# Patient Record
Sex: Female | Born: 1980
Health system: Southern US, Community
[De-identification: ages and names within clinical notes are randomized; demographics above are authoritative.]

## PROBLEM LIST (undated history)

## (undated) DIAGNOSIS — Z8489 Family history of other specified conditions: Secondary | ICD-10-CM

## (undated) DIAGNOSIS — Z1502 Genetic susceptibility to malignant neoplasm of ovary: Secondary | ICD-10-CM

## (undated) DIAGNOSIS — M542 Cervicalgia: Secondary | ICD-10-CM

## (undated) DIAGNOSIS — F329 Major depressive disorder, single episode, unspecified: Secondary | ICD-10-CM

## (undated) DIAGNOSIS — R51 Headache: Secondary | ICD-10-CM

## (undated) DIAGNOSIS — Z1379 Encounter for other screening for genetic and chromosomal anomalies: Secondary | ICD-10-CM

## (undated) DIAGNOSIS — F32A Depression, unspecified: Secondary | ICD-10-CM

## (undated) DIAGNOSIS — R519 Headache, unspecified: Secondary | ICD-10-CM

## (undated) DIAGNOSIS — K219 Gastro-esophageal reflux disease without esophagitis: Secondary | ICD-10-CM

## (undated) DIAGNOSIS — Z1589 Genetic susceptibility to other disease: Secondary | ICD-10-CM

## (undated) DIAGNOSIS — Z803 Family history of malignant neoplasm of breast: Secondary | ICD-10-CM

## (undated) DIAGNOSIS — Z9189 Other specified personal risk factors, not elsewhere classified: Secondary | ICD-10-CM

## (undated) HISTORY — PX: APPENDECTOMY: SHX54

## (undated) HISTORY — DX: Depression, unspecified: F32.A

## (undated) HISTORY — DX: Genetic susceptibility to other disease: Z15.89

## (undated) HISTORY — DX: Genetic susceptibility to malignant neoplasm of ovary: Z15.02

## (undated) HISTORY — DX: Other specified personal risk factors, not elsewhere classified: Z91.89

## (undated) HISTORY — DX: Major depressive disorder, single episode, unspecified: F32.9

## (undated) HISTORY — DX: Encounter for other screening for genetic and chromosomal anomalies: Z13.79

## (undated) HISTORY — DX: Family history of malignant neoplasm of breast: Z80.3

## (undated) HISTORY — DX: Cervicalgia: M54.2

---

## 2005-10-25 ENCOUNTER — Ambulatory Visit: Payer: Self-pay

## 2009-09-13 ENCOUNTER — Observation Stay: Payer: Self-pay | Admitting: Obstetrics and Gynecology

## 2009-09-16 ENCOUNTER — Inpatient Hospital Stay: Payer: Self-pay

## 2014-08-16 DIAGNOSIS — Z1379 Encounter for other screening for genetic and chromosomal anomalies: Secondary | ICD-10-CM

## 2014-08-16 DIAGNOSIS — Z1589 Genetic susceptibility to other disease: Secondary | ICD-10-CM

## 2014-08-16 HISTORY — DX: Encounter for other screening for genetic and chromosomal anomalies: Z13.79

## 2014-08-16 HISTORY — DX: Genetic susceptibility to other disease: Z15.89

## 2014-08-20 ENCOUNTER — Ambulatory Visit: Payer: Self-pay | Admitting: Nurse Practitioner

## 2014-12-16 ENCOUNTER — Other Ambulatory Visit: Payer: Self-pay | Admitting: Nurse Practitioner

## 2016-11-30 ENCOUNTER — Other Ambulatory Visit: Payer: Self-pay | Admitting: Obstetrics & Gynecology

## 2016-11-30 DIAGNOSIS — R1900 Intra-abdominal and pelvic swelling, mass and lump, unspecified site: Secondary | ICD-10-CM

## 2016-12-04 ENCOUNTER — Ambulatory Visit: Payer: BLUE CROSS/BLUE SHIELD

## 2017-05-11 ENCOUNTER — Other Ambulatory Visit: Payer: Self-pay | Admitting: Obstetrics & Gynecology

## 2017-05-11 DIAGNOSIS — R198 Other specified symptoms and signs involving the digestive system and abdomen: Secondary | ICD-10-CM

## 2017-06-19 ENCOUNTER — Other Ambulatory Visit: Payer: Self-pay | Admitting: Gastroenterology

## 2017-06-19 DIAGNOSIS — R1013 Epigastric pain: Secondary | ICD-10-CM

## 2017-07-04 ENCOUNTER — Ambulatory Visit: Payer: BLUE CROSS/BLUE SHIELD

## 2017-07-17 ENCOUNTER — Encounter
Admission: RE | Admit: 2017-07-17 | Discharge: 2017-07-17 | Disposition: A | Discharge status: Home / Self Care | Payer: 59 | Source: Ambulatory Visit | Attending: Gastroenterology | Admitting: Gastroenterology

## 2017-07-17 ENCOUNTER — Other Ambulatory Visit: Payer: Self-pay | Admitting: Gastroenterology

## 2017-07-17 ENCOUNTER — Ambulatory Visit
Admission: RE | Admit: 2017-07-17 | Discharge: 2017-07-17 | Disposition: A | Discharge status: Home / Self Care | Payer: 59 | Source: Ambulatory Visit | Attending: Gastroenterology | Admitting: Gastroenterology

## 2017-07-17 DIAGNOSIS — R935 Abnormal findings on diagnostic imaging of other abdominal regions, including retroperitoneum: Secondary | ICD-10-CM | POA: Insufficient documentation

## 2017-07-17 DIAGNOSIS — R1013 Epigastric pain: Secondary | ICD-10-CM

## 2017-07-17 DIAGNOSIS — R1011 Right upper quadrant pain: Secondary | ICD-10-CM | POA: Diagnosis not present

## 2017-07-17 MED ORDER — TECHNETIUM TC 99M MEBROFENIN IV KIT
5.0000 | PACK | Freq: Once | INTRAVENOUS | Status: AC | PRN
  Administered 2017-07-17: 5.45 via INTRAVENOUS

## 2017-07-18 ENCOUNTER — Encounter: Payer: Self-pay | Admitting: *Deleted

## 2017-07-24 ENCOUNTER — Encounter: Payer: Self-pay | Admitting: *Deleted

## 2017-07-30 ENCOUNTER — Ambulatory Visit (INDEPENDENT_AMBULATORY_CARE_PROVIDER_SITE_OTHER): Payer: 59 | Admitting: General Surgery

## 2017-07-30 ENCOUNTER — Encounter: Payer: Self-pay | Admitting: *Deleted

## 2017-07-30 ENCOUNTER — Encounter: Payer: Self-pay | Admitting: General Surgery

## 2017-07-30 VITALS — BP 130/74 | HR 70 | Resp 14 | Ht 61.0 in | Wt 207.0 lb

## 2017-07-30 DIAGNOSIS — Z803 Family history of malignant neoplasm of breast: Secondary | ICD-10-CM | POA: Diagnosis not present

## 2017-07-30 DIAGNOSIS — K8020 Calculus of gallbladder without cholecystitis without obstruction: Secondary | ICD-10-CM

## 2017-07-30 NOTE — Progress Notes (Signed)
Patient ID: Katie Banks, female   DOB: April 19, 1981, 36 y.o.   MRN: [id number]  Chief Complaint  Patient presents with  . Abdominal Pain    HPI Katie Banks is a 36 y.o. female here today for a evaluation of abdominal pain .She states the pain starts in her epigastric and radiations  to her right shoulder blades. The pain started about a year ago. Patient states she has had about 20 attack in the last year and the pain last for about 30 minutes.Sometimes she wakes up in the middle of the night. No foods trigger attacks. Patient had a ultrasound  and HIDA scan on 07/17/2017. Strong family history of breat cancer. Patient has had genetic testing done she was negative for breast and positive for ovarian.Husband, Katie Banks is present is present at visit.   HPI  Past Medical History:  Diagnosis Date  . Cervicalgia   . Depression     Past Surgical History:  Procedure Laterality Date  . APPENDECTOMY      Family History  Problem Relation Age of Onset  . Breast cancer Mother        81 and 60  . Ovarian cancer Mother   . Colon polyps Father   . Breast cancer Maternal Grandmother        28 and 60    Social History Social History  Substance Use Topics  . Smoking status: Never Smoker  . Smokeless tobacco: Never Used  . Alcohol use No    No Known Allergies  No current outpatient prescriptions on file.   No current facility-administered medications for this visit.     Review of Systems Review of Systems  Constitutional: Negative.   Respiratory: Negative.   Cardiovascular: Negative.   Gastrointestinal: Positive for abdominal pain, diarrhea and nausea.    Blood pressure 130/74, pulse 70, resp. rate 14, height  (1.549 m), weight 207 lb (93.9 kg), last menstrual period 07/12/2017.  Physical Exam Physical Exam  Constitutional: She is oriented to person, place, and time. She appears well-developed and well-nourished.  Eyes: Conjunctivae are normal. No scleral  icterus.  Neck: Neck supple.  Cardiovascular: Normal rate, regular rhythm and normal heart sounds.   Pulmonary/Chest: Effort normal and breath sounds normal.  Abdominal: Soft. Normal appearance and bowel sounds are normal. There is no hepatomegaly. There is no tenderness. No hernia.  Lymphadenopathy:    She has no cervical adenopathy.  Neurological: She is alert and oriented to person, place, and time.  Skin: Skin is warm and dry.    Data Reviewed Abdominal ultrasound of 07/17/2017 showed what was described as a 1.2 cm stone. HIDA scan showed a patent cystic duct.  Assessment    Symptom medical cholelithiasis.    Plan    The patient was fairly vague about what genetic testing had been done and was positive for what. She was amenable to have these records reviewed from [facility] were testing was completed.   Laparoscopic Cholecystectomy with Intraoperative Cholangiogram. The procedure, including it's potential risks and complications (including but not limited to infection, bleeding, injury to intra-abdominal organs or bile ducts, bile leak, poor cosmetic result, sepsis and death) were discussed with the patient in detail. Non-operative options, including their inherent risks (acute calculous cholecystitis with possible choledocholithiasis or gallstone pancreatitis, with the risk of ascending cholangitis, sepsis, and death) were discussed as well. The patient expressed and understanding of what we discussed and wishes to proceed with laparoscopic cholecystectomy. The patient further understands that if  it is technically not possible, or it is unsafe to proceed laparoscopically, that I will convert to an open cholecystectomy.     HPI, Physical Exam, Assessment and Plan have been scribed under the direction and in the presence of Donnalee Curry, MD.  Ples Specter, CMA  I have completed the exam and reviewed the above documentation for accuracy and completeness.  I agree with the above.   Museum/gallery conservator has been used and any errors in dictation or transcription are unintentional.  Donnalee Curry, M.D., F.A.C.S.   Earline Mayotte 07/31/2017, 8:05 PM  Patient's surgery has been scheduled for 08-24-17 at [facility].   Nicholes Mango, CMA

## 2017-07-31 DIAGNOSIS — K8020 Calculus of gallbladder without cholecystitis without obstruction: Secondary | ICD-10-CM | POA: Insufficient documentation

## 2017-07-31 DIAGNOSIS — Z803 Family history of malignant neoplasm of breast: Secondary | ICD-10-CM | POA: Insufficient documentation

## 2017-08-17 ENCOUNTER — Encounter: Payer: Self-pay | Admitting: *Deleted

## 2017-08-17 ENCOUNTER — Encounter
Admission: RE | Admit: 2017-08-17 | Discharge: 2017-08-17 | Disposition: A | Discharge status: Home / Self Care | Payer: 59 | Source: Ambulatory Visit | Attending: General Surgery | Admitting: General Surgery

## 2017-08-17 HISTORY — DX: Family history of other specified conditions: Z84.89

## 2017-08-17 HISTORY — DX: Headache, unspecified: R51.9

## 2017-08-17 HISTORY — DX: Gastro-esophageal reflux disease without esophagitis: K21.9

## 2017-08-17 HISTORY — DX: Headache: R51

## 2017-08-17 NOTE — Patient Instructions (Signed)
  Your procedure is scheduled on: 08-24-17 Report to Same Day Surgery 2nd floor medical mall [facility](Medical Mall Entrance-take elevator on left to 2nd floor.  Check in with surgery information desk.) To find out your arrival time please call [phone] between 1PM - 3PM on 08-23-17  Remember: Instructions that are not followed completely may result in serious medical risk, up to and including death, or upon the discretion of your surgeon and anesthesiologist your surgery may need to be rescheduled.    _x___ 1. Do not eat food after midnight the night before your procedure. You may drink clear liquids up to 2 hours before you are scheduled to arrive at the hospital for your procedure.  Do not drink clear liquids within 2 hours of your scheduled arrival to the hospital.  Clear liquids include  --Water or Apple juice without pulp  --Clear carbohydrate beverage such as ClearFast or Gatorade  --Black Coffee or Clear Tea (No milk, no creamers, do not add anything to the coffee or Tea  No gum chewing or hard candies.       __x__ 2. No Alcohol for 24 hours before or after surgery.   __x__3. No Smoking for 24 prior to surgery.   ____  4. Bring all medications with you on the day of surgery if instructed.    __x__ 5. Notify your doctor if there is any change in your medical condition     (cold, fever, infections).     Do not wear jewelry, make-up, hairpins, clips or nail polish.  Do not wear lotions, powders, or perfumes. You may wear deodorant.  Do not shave 48 hours prior to surgery. Men may shave face and neck.  Do not bring valuables to the hospital.    [facility] not [facility] for any belongings or valuables.               Contacts, dentures or bridgework may not be worn into surgery.  Leave your suitcase in the car. After surgery it may be brought to your room.  For patients admitted to the hospital, discharge time is determined by your treatment team.   Patients discharged the day of  surgery will not be allowed to drive home.  You will need someone to drive you home and stay with you the night of your procedure.       ____ Take anti-hypertensive listed below, cardiac, seizure, asthma,     anti-reflux and psychiatric medicines. These include:  1. NONE  2.  3.  4.  5.  6.  ____Fleets enema or Magnesium Citrate as directed.   _x___ Use CHG Soap or sage wipes as directed on instruction sheet   ____ Use inhalers on the day of surgery and bring to hospital day of surgery  ____ Stop Metformin and Janumet 2 days prior to surgery.    ____ Take 1/2 of usual insulin dose the night before surgery and none on the morning  surgery.   ____ Follow recommendations from Cardiologist, Pulmonologist or PCP regarding  stopping Aspirin, Coumadin, Plavix ,Eliquis, Effient, or Pradaxa, and Pletal.  ____Stop Anti-inflammatories such as Advil, Aleve, Ibuprofen, Motrin, Naproxen, Naprosyn, Goodies powders or aspirin products. OK to take Tylenol    ____ Stop supplements until after surgery.    ____ Bring C-Pap to the hospital.

## 2017-08-24 ENCOUNTER — Ambulatory Visit: Payer: 59 | Admitting: Anesthesiology

## 2017-08-24 ENCOUNTER — Encounter
Admission: RE | Disposition: A | Discharge status: Home / Self Care | Payer: Self-pay | Source: Ambulatory Visit | Attending: General Surgery

## 2017-08-24 ENCOUNTER — Ambulatory Visit: Payer: 59

## 2017-08-24 ENCOUNTER — Encounter: Payer: Self-pay | Admitting: *Deleted

## 2017-08-24 ENCOUNTER — Ambulatory Visit
Admission: RE | Admit: 2017-08-24 | Discharge: 2017-08-24 | Disposition: A | Discharge status: Home / Self Care | Payer: 59 | Source: Ambulatory Visit | Attending: General Surgery | Admitting: General Surgery

## 2017-08-24 DIAGNOSIS — F329 Major depressive disorder, single episode, unspecified: Secondary | ICD-10-CM | POA: Insufficient documentation

## 2017-08-24 DIAGNOSIS — K8010 Calculus of gallbladder with chronic cholecystitis without obstruction: Secondary | ICD-10-CM | POA: Insufficient documentation

## 2017-08-24 DIAGNOSIS — K8020 Calculus of gallbladder without cholecystitis without obstruction: Secondary | ICD-10-CM | POA: Diagnosis not present

## 2017-08-24 HISTORY — PX: LAPAROSCOPIC LYSIS OF ADHESIONS: SHX5905

## 2017-08-24 HISTORY — PX: CHOLECYSTECTOMY: SHX55

## 2017-08-24 LAB — POCT PREGNANCY, URINE: Preg Test, Ur: NEGATIVE

## 2017-08-24 SURGERY — LAPAROSCOPIC CHOLECYSTECTOMY WITH INTRAOPERATIVE CHOLANGIOGRAM
Anesthesia: General | Wound class: Clean Contaminated

## 2017-08-24 MED ORDER — FENTANYL CITRATE (PF) 100 MCG/2ML IJ SOLN
INTRAMUSCULAR | Status: AC
  Filled 2017-08-24: qty 2

## 2017-08-24 MED ORDER — ONDANSETRON HCL 4 MG/2ML IJ SOLN
INTRAMUSCULAR | Status: AC
  Filled 2017-08-24: qty 2

## 2017-08-24 MED ORDER — HYDROCODONE-ACETAMINOPHEN 5-325 MG PO TABS: 1.0000 | ORAL_TABLET | ORAL | 0 refills | Status: AC | PRN

## 2017-08-24 MED ORDER — MIDAZOLAM HCL 2 MG/2ML IJ SOLN
INTRAMUSCULAR | Status: DC | PRN
  Administered 2017-08-24: 2 mg via INTRAVENOUS

## 2017-08-24 MED ORDER — DEXAMETHASONE SODIUM PHOSPHATE 10 MG/ML IJ SOLN
INTRAMUSCULAR | Status: AC
  Filled 2017-08-24: qty 1

## 2017-08-24 MED ORDER — OXYCODONE HCL 5 MG/5ML PO SOLN: 5.0000 mg | Freq: Once | ORAL | Status: AC | PRN

## 2017-08-24 MED ORDER — FENTANYL CITRATE (PF) 100 MCG/2ML IJ SOLN
INTRAMUSCULAR | Status: AC
  Administered 2017-08-24: 50 ug via INTRAVENOUS
  Filled 2017-08-24: qty 2

## 2017-08-24 MED ORDER — KETOROLAC TROMETHAMINE 30 MG/ML IJ SOLN
INTRAMUSCULAR | Status: DC | PRN
  Administered 2017-08-24: 30 mg via INTRAVENOUS

## 2017-08-24 MED ORDER — PHENYLEPHRINE HCL 10 MG/ML IJ SOLN
INTRAMUSCULAR | Status: AC
  Filled 2017-08-24: qty 1

## 2017-08-24 MED ORDER — LIDOCAINE HCL (CARDIAC) 20 MG/ML IV SOLN
INTRAVENOUS | Status: DC | PRN
Start: 2017-08-24 — End: 2017-08-24
  Administered 2017-08-24: 100 mg via INTRAVENOUS

## 2017-08-24 MED ORDER — DEXAMETHASONE SODIUM PHOSPHATE 10 MG/ML IJ SOLN
INTRAMUSCULAR | Status: DC | PRN
  Administered 2017-08-24: 10 mg via INTRAVENOUS

## 2017-08-24 MED ORDER — OXYCODONE HCL 5 MG PO TABS
ORAL_TABLET | ORAL | Status: AC
  Administered 2017-08-24: 5 mg via ORAL
  Filled 2017-08-24: qty 1

## 2017-08-24 MED ORDER — ROCURONIUM BROMIDE 100 MG/10ML IV SOLN
INTRAVENOUS | Status: DC | PRN
  Administered 2017-08-24: 50 mg via INTRAVENOUS

## 2017-08-24 MED ORDER — ONDANSETRON HCL 4 MG/2ML IJ SOLN
INTRAMUSCULAR | Status: DC | PRN
  Administered 2017-08-24: 4 mg via INTRAVENOUS

## 2017-08-24 MED ORDER — MIDAZOLAM HCL 2 MG/2ML IJ SOLN
INTRAMUSCULAR | Status: AC
  Filled 2017-08-24: qty 2

## 2017-08-24 MED ORDER — EPHEDRINE SULFATE 50 MG/ML IJ SOLN
INTRAMUSCULAR | Status: AC
  Filled 2017-08-24: qty 1

## 2017-08-24 MED ORDER — SUCCINYLCHOLINE CHLORIDE 20 MG/ML IJ SOLN
INTRAMUSCULAR | Status: AC
  Filled 2017-08-24: qty 1

## 2017-08-24 MED ORDER — SODIUM CHLORIDE 0.9 % IJ SOLN
INTRAMUSCULAR | Status: AC
  Filled 2017-08-24: qty 50

## 2017-08-24 MED ORDER — OXYCODONE HCL 5 MG PO TABS
5.0000 mg | ORAL_TABLET | Freq: Once | ORAL | Status: AC | PRN
  Administered 2017-08-24: 5 mg via ORAL

## 2017-08-24 MED ORDER — PROPOFOL 10 MG/ML IV BOLUS
INTRAVENOUS | Status: DC | PRN
  Administered 2017-08-24: 150 mg via INTRAVENOUS

## 2017-08-24 MED ORDER — FAMOTIDINE 20 MG PO TABS
20.0000 mg | ORAL_TABLET | Freq: Once | ORAL | Status: AC
  Administered 2017-08-24: 20 mg via ORAL

## 2017-08-24 MED ORDER — SUGAMMADEX SODIUM 200 MG/2ML IV SOLN
INTRAVENOUS | Status: DC | PRN
  Administered 2017-08-24: 200 mg via INTRAVENOUS

## 2017-08-24 MED ORDER — FENTANYL CITRATE (PF) 100 MCG/2ML IJ SOLN
INTRAMUSCULAR | Status: DC | PRN
  Administered 2017-08-24 (×2): 50 ug via INTRAVENOUS

## 2017-08-24 MED ORDER — GLYCOPYRROLATE 0.2 MG/ML IJ SOLN
INTRAMUSCULAR | Status: AC
  Filled 2017-08-24: qty 1

## 2017-08-24 MED ORDER — ROCURONIUM BROMIDE 50 MG/5ML IV SOLN
INTRAVENOUS | Status: AC
  Filled 2017-08-24: qty 1

## 2017-08-24 MED ORDER — SODIUM CHLORIDE 0.9 % IV SOLN
INTRAVENOUS | Status: DC | PRN
  Administered 2017-08-24: 14 mL

## 2017-08-24 MED ORDER — MEPERIDINE HCL 50 MG/ML IJ SOLN: 6.2500 mg | INTRAMUSCULAR | Status: DC | PRN

## 2017-08-24 MED ORDER — ACETAMINOPHEN 10 MG/ML IV SOLN
INTRAVENOUS | Status: AC
  Filled 2017-08-24: qty 100

## 2017-08-24 MED ORDER — LIDOCAINE HCL (PF) 2 % IJ SOLN
INTRAMUSCULAR | Status: AC
  Filled 2017-08-24: qty 10

## 2017-08-24 MED ORDER — FENTANYL CITRATE (PF) 100 MCG/2ML IJ SOLN
25.0000 ug | INTRAMUSCULAR | Status: DC | PRN
  Administered 2017-08-24 (×2): 50 ug via INTRAVENOUS

## 2017-08-24 MED ORDER — LACTATED RINGERS IV SOLN
INTRAVENOUS | Status: DC
  Administered 2017-08-24: 07:00:00 via INTRAVENOUS

## 2017-08-24 MED ORDER — ACETAMINOPHEN 10 MG/ML IV SOLN
INTRAVENOUS | Status: DC | PRN
  Administered 2017-08-24: 1000 mg via INTRAVENOUS

## 2017-08-24 MED ORDER — FAMOTIDINE 20 MG PO TABS
ORAL_TABLET | ORAL | Status: AC
  Administered 2017-08-24: 20 mg via ORAL
  Filled 2017-08-24: qty 1

## 2017-08-24 MED ORDER — PROPOFOL 10 MG/ML IV BOLUS
INTRAVENOUS | Status: AC
  Filled 2017-08-24: qty 20

## 2017-08-24 MED ORDER — PROMETHAZINE HCL 25 MG/ML IJ SOLN: 6.2500 mg | INTRAMUSCULAR | Status: DC | PRN

## 2017-08-24 SURGICAL SUPPLY — 40 items
APPLIER CLIP ROT 10 11.4 M/L (STAPLE) ×4
BLADE SURG 11 STRL SS SAFETY (MISCELLANEOUS) ×4 IMPLANT
CANISTER SUCT 1200ML W/VALVE (MISCELLANEOUS) ×4 IMPLANT
CANNULA DILATOR  5MM W/SLV (CANNULA) ×2
CANNULA DILATOR 10 W/SLV (CANNULA) ×3 IMPLANT
CANNULA DILATOR 10MM W/SLV (CANNULA) ×1
CANNULA DILATOR 5 W/SLV (CANNULA) ×6 IMPLANT
CATH CHOLANG 76X19 KUMAR (CATHETERS) ×4 IMPLANT
CHLORAPREP W/TINT 26ML (MISCELLANEOUS) ×4 IMPLANT
CLIP APPLIE ROT 10 11.4 M/L (STAPLE) ×2 IMPLANT
CLOSURE WOUND 1/2 X4 (GAUZE/BANDAGES/DRESSINGS) ×1
CONRAY 60ML FOR OR (MISCELLANEOUS) ×4 IMPLANT
DISSECTOR KITTNER STICK (MISCELLANEOUS) ×2 IMPLANT
DISSECTORS/KITTNER STICK (MISCELLANEOUS) ×4
DRAPE SHEET LG 3/4 BI-LAMINATE (DRAPES) ×4 IMPLANT
DRSG TEGADERM 2-3/8X2-3/4 SM (GAUZE/BANDAGES/DRESSINGS) ×16 IMPLANT
DRSG TELFA 4X3 1S NADH ST (GAUZE/BANDAGES/DRESSINGS) ×4 IMPLANT
ELECT REM PT RETURN 9FT ADLT (ELECTROSURGICAL) ×4
ELECTRODE REM PT RTRN 9FT ADLT (ELECTROSURGICAL) ×2 IMPLANT
GLOVE BIO SURGEON STRL SZ7.5 (GLOVE) ×12 IMPLANT
GLOVE INDICATOR 8.0 STRL GRN (GLOVE) ×4 IMPLANT
GOWN STRL REUS W/ TWL LRG LVL3 (GOWN DISPOSABLE) ×6 IMPLANT
GOWN STRL REUS W/TWL LRG LVL3 (GOWN DISPOSABLE) ×6
IRRIGATION STRYKERFLOW (MISCELLANEOUS) ×2 IMPLANT
IRRIGATOR STRYKERFLOW (MISCELLANEOUS) ×4
IV LACTATED RINGERS 1000ML (IV SOLUTION) ×4 IMPLANT
KIT RM TURNOVER STRD PROC AR (KITS) ×4 IMPLANT
LABEL OR SOLS (LABEL) ×4 IMPLANT
NDL INSUFF ACCESS 14 VERSASTEP (NEEDLE) ×4 IMPLANT
NS IRRIG 500ML POUR BTL (IV SOLUTION) ×4 IMPLANT
PACK LAP CHOLECYSTECTOMY (MISCELLANEOUS) ×4 IMPLANT
POUCH SPECIMEN RETRIEVAL 10MM (ENDOMECHANICALS) ×4 IMPLANT
SCISSORS METZENBAUM CVD 33 (INSTRUMENTS) ×4 IMPLANT
STRIP CLOSURE SKIN 1/2X4 (GAUZE/BANDAGES/DRESSINGS) ×3 IMPLANT
SUT VIC AB 0 CT2 27 (SUTURE) ×4 IMPLANT
SUT VIC AB 4-0 FS2 27 (SUTURE) ×4 IMPLANT
SWABSTK COMLB BENZOIN TINCTURE (MISCELLANEOUS) ×4 IMPLANT
TROCAR XCEL NON-BLD 11X100MML (ENDOMECHANICALS) ×4 IMPLANT
TUBING INSUFFLATOR HI FLOW (MISCELLANEOUS) ×4 IMPLANT
WATER STERILE IRR 1000ML POUR (IV SOLUTION) ×4 IMPLANT

## 2017-08-24 NOTE — Anesthesia Procedure Notes (Signed)
Procedure Name: Intubation Date/Time: 08/24/2017 7:29 AM Performed by: Doreen Salvage, CRNA Pre-anesthesia Checklist: Patient identified, Patient being monitored, Timeout performed, Emergency Drugs available and Suction available Patient Re-evaluated:Patient Re-evaluated prior to induction Oxygen Delivery Method: Circle system utilized Preoxygenation: Pre-oxygenation with 100% oxygen Induction Type: IV induction Ventilation: Mask ventilation without difficulty Laryngoscope Size: Mac and 3 Grade View: Grade I Tube type: Oral Tube size: 7.0 mm Number of attempts: 1 Airway Equipment and Method: Stylet Placement Confirmation: ETT inserted through vocal cords under direct vision,  positive ETCO2 and breath sounds checked- equal and bilateral Secured at: 21 cm Tube secured with: Tape Dental Injury: Teeth and Oropharynx as per pre-operative assessment  Difficulty Due To: Difficult Airway- due to limited oral opening and Difficult Airway- due to large tongue

## 2017-08-24 NOTE — Anesthesia Postprocedure Evaluation (Signed)
Anesthesia Post Note  Patient: Katie Banks  Procedure(s) Performed: LAPAROSCOPIC CHOLECYSTECTOMY WITH INTRAOPERATIVE CHOLANGIOGRAM (N/A ) LAPAROSCOPIC LYSIS OF ADHESIONS  Patient location during evaluation: PACU Anesthesia Type: General Level of consciousness: awake and alert and oriented Pain management: pain level controlled Vital Signs Assessment: post-procedure vital signs reviewed and stable Respiratory status: spontaneous breathing, nonlabored ventilation and respiratory function stable Cardiovascular status: blood pressure returned to baseline and stable Postop Assessment: no signs of nausea or vomiting Anesthetic complications: no     Last Vitals:  Vitals:   08/24/17 0937 08/24/17 1014  BP: 113/71 107/61  Pulse: (!) 58 61  Resp: 16   Temp: (!) 35.6 C   SpO2: 98% 98%    Last Pain:  Vitals:   08/24/17 1014  TempSrc:   PainSc: 3                  Renuka Farfan

## 2017-08-24 NOTE — Anesthesia Post-op Follow-up Note (Signed)
Anesthesia QCDR form completed.        

## 2017-08-24 NOTE — Transfer of Care (Signed)
Immediate Anesthesia Transfer of Care Note  Patient: Katie Banks  Procedure(s) Performed: Procedure(s): LAPAROSCOPIC CHOLECYSTECTOMY WITH INTRAOPERATIVE CHOLANGIOGRAM (N/A) LAPAROSCOPIC LYSIS OF ADHESIONS  Patient Location: PACU  Anesthesia Type:General  Level of Consciousness: sedated  Airway & Oxygen Therapy: Patient Spontanous Breathing and Patient connected to face mask oxygen  Post-op Assessment: Report given to RN and Post -op Vital signs reviewed and stable  Post vital signs: Reviewed and stable  Last Vitals:  Vitals:   08/24/17 0622 08/24/17 0836  BP: 138/86 114/67  Pulse: 74 78  Resp: 16 17  Temp: (!) 36.3 C (!) 36.3 C  SpO2: 98% 100%    Complications: No apparent anesthesia complications

## 2017-08-24 NOTE — Progress Notes (Signed)
Dressings x4 to abdmen clean and dry with gauze and tape

## 2017-08-24 NOTE — Progress Notes (Signed)
Dr. Byrnett into see 

## 2017-08-24 NOTE — H&P (Signed)
Katie Banks [id number] Apr 11, 1981     HPI: 36 y/o woman for cholecystectomy. Still with intermittent epigastric pain.    Medications Prior to Admission  Medication Sig Dispense Refill Last Dose  . acetaminophen (TYLENOL) 325 MG tablet Take 650 mg by mouth every 6 (six) hours as needed for moderate pain or headache.   08/23/2017 at Unknown time  . ibuprofen (ADVIL,MOTRIN) 200 MG tablet Take 200 mg by mouth every 6 (six) hours as needed.   08/23/2017 at Unknown time  . naproxen sodium (ANAPROX) 220 MG tablet Take 440 mg by mouth 2 (two) times daily as needed (for pain or headache).   Not Taking at Unknown time   No Known Allergies Past Medical History:  Diagnosis Date  . Cervicalgia   . Depression   . Family history of adverse reaction to anesthesia    MOM-N/V  . GERD (gastroesophageal reflux disease)    OCC- NO MEDS  . Headache    MIGRAINES   Past Surgical History:  Procedure Laterality Date  . APPENDECTOMY     Social History   Socioeconomic History  . Marital status: Married    Spouse name: Not on file  . Number of children: Not on file  . Years of education: Not on file  . Highest education level: Not on file  Social Needs  . Financial resource strain: Not on file  . Food insecurity - worry: Not on file  . Food insecurity - inability: Not on file  . Transportation needs - medical: Not on file  . Transportation needs - non-medical: Not on file  Occupational History  . Not on file  Tobacco Use  . Smoking status: Never Smoker  . Smokeless tobacco: Never Used  Substance and Sexual Activity  . Alcohol use: No  . Drug use: No  . Sexual activity: Not on file  Other Topics Concern  . Not on file  Social History Narrative  . Not on file   Social History   Social History Narrative  . Not on file     ROS: Negative.     PE: HEENT: Negative. Lungs: Clear. Cardio: RR.  Assessment/Plan:  Proceed with planned cholecystectomy.   Earline MayotteByrnett, Christabel Camire  W 08/24/2017

## 2017-08-24 NOTE — Anesthesia Preprocedure Evaluation (Signed)
Anesthesia Evaluation  Patient identified by MRN, date of birth, ID band Patient awake    Reviewed: Allergy & Precautions, NPO status , Patient's Chart, lab work & pertinent test results  History of Anesthesia Complications Negative for: history of anesthetic complications  Airway Mallampati: III  TM Distance: >3 FB Neck ROM: Full    Dental no notable dental hx.    Pulmonary neg pulmonary ROS, neg sleep apnea, neg COPD,    breath sounds clear to auscultation- rhonchi (-) wheezing      Cardiovascular Exercise Tolerance: Good (-) hypertension(-) CAD and (-) Past MI  Rhythm:Regular Rate:Normal - Systolic murmurs and - Diastolic murmurs    Neuro/Psych  Headaches, PSYCHIATRIC DISORDERS Depression    GI/Hepatic Neg liver ROS, GERD  ,  Endo/Other  negative endocrine ROSneg diabetes  Renal/GU negative Renal ROS     Musculoskeletal negative musculoskeletal ROS (+)   Abdominal (+) + obese,   Peds  Hematology negative hematology ROS (+)   Anesthesia Other Findings Past Medical History: No date: Cervicalgia No date: Depression No date: Family history of adverse reaction to anesthesia     Comment:  MOM-N/V No date: GERD (gastroesophageal reflux disease)     Comment:  OCC- NO MEDS No date: Headache     Comment:  MIGRAINES   Reproductive/Obstetrics                             Anesthesia Physical Anesthesia Plan  ASA: II  Anesthesia Plan: General   Post-op Pain Management:    Induction: Intravenous  PONV Risk Score and Plan: 2 and Dexamethasone and Ondansetron  Airway Management Planned: Oral ETT  Additional Equipment:   Intra-op Plan:   Post-operative Plan: Extubation in OR  Informed Consent: I have reviewed the patients History and Physical, chart, labs and discussed the procedure including the risks, benefits and alternatives for the proposed anesthesia with the patient or  authorized representative who has indicated his/her understanding and acceptance.   Dental advisory given  Plan Discussed with: CRNA and Anesthesiologist  Anesthesia Plan Comments:         Anesthesia Quick Evaluation

## 2017-08-24 NOTE — Op Note (Signed)
Preoperative diagnosis: Chronic cholecystitis and cholelithiasis.  Postoperative diagnosis same, intra-abdominal adhesions.  Operative procedure: Laparoscopic cholecystectomy with intraoperative cholangiograms, lysis of adhesions.  Operating Surgeon: Donnalee CurryJeffrey Chistina Roston, MD.  Anesthesia: General endotracheal.  Estimated blood loss: Less than 5 cc.  Clinical note: This 36 year old woman has had episodic epigastric pain.  Ultrasound showed evidence of cholelithiasis.  She was admitted for elective cholecystectomy.  Operative note: The patient underwent general endotracheal anesthesia without difficulty.  The abdomen was cleansed with ChloraPrep and draped.  In Trendelenburg position a varies needle was placed through a trans-umbilical incision.  After assuring intra-abdominal location with a hanging drop test the abdomen was insufflated with CO2 at 10 mmHg pressure.  A 10 mm Step port was expanded and inspection showed no evidence of injury from initial port placement.  There was however noted a band of adhesion tethering a loop of small bowel to the anterior abdominal wall just below the level of the umbilicus.  The patient was placed in the reverse Trendelenburg position and rolled to the left.  An 11 mm XL port was placed in the epigastrium and 2-5 mm Step ports were placed in the right lateral abdominal wall.  The gallbladder was placed on cephalad traction.  The 0 degree scope was changed to a 30 degree scope to better visualize the neck of the gallbladder.  This was placed on the caudal traction and the cystic duct identified and dissected free.  Fluoroscopic cholangiograms were completed using 14 cc of one half strength Conray 60.  This showed prompt filling of the right and left hepatic duct common bile duct and common hepatic duct.  Free flowing of the duodenum.  The cystic duct in both the anterior and posterior branches of the cystic artery were doubly clipped and divided.  There was noted to be  a fairly sizable, inflamed cystic duct lymph node which was left in place.  The gallbladder was removed from the liver bed make use of hook cautery dissection.  He was delivered to the umbilical port site without incident.  After reestablishing pneumoperitoneum the scissors were used to divide the band of adhesions tethering the small bowel to the anterior abdominal wall without incident.  The right upper quadrant was irrigated with lactated Ringer solution.  Good hemostasis was noted.  The abdomen was then desufflated and ports removed under direct vision.  A single 0 Vicryl suture was placed at the umbilical fascia.  Skin incisions closed with 4-0 Vicryl subarticular sutures.  Benzoin, Steri-Strips, Telfa and Tegaderm dressing applied.  The patient tolerated the procedure well and was taken to recovery room in stable condition.

## 2017-08-27 LAB — SURGICAL PATHOLOGY

## 2017-08-30 ENCOUNTER — Telehealth: Payer: Self-pay | Admitting: *Deleted

## 2017-08-30 NOTE — Telephone Encounter (Signed)
She called to let Dr Lemar LivingsByrnett know she was planning on going to work on Saturday. She is post lap cholecystectomy on 08-24-17. 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 Gradually increase and resume activities as tolerated. The patient is aware to use a heating pad as needed for comfort. Follow up as scheduled.

## 2017-09-04 ENCOUNTER — Ambulatory Visit: Payer: 59 | Admitting: General Surgery

## 2017-09-04 ENCOUNTER — Ambulatory Visit (INDEPENDENT_AMBULATORY_CARE_PROVIDER_SITE_OTHER): Payer: 59 | Admitting: General Surgery

## 2017-09-04 ENCOUNTER — Encounter: Payer: Self-pay | Admitting: General Surgery

## 2017-09-04 VITALS — BP 130/80 | HR 86 | Resp 14 | Ht 62.0 in | Wt 205.0 lb

## 2017-09-04 DIAGNOSIS — K8020 Calculus of gallbladder without cholecystitis without obstruction: Secondary | ICD-10-CM

## 2017-09-04 NOTE — Patient Instructions (Signed)
Return as needed

## 2017-09-04 NOTE — Progress Notes (Signed)
Patient ID: Katie GentaMelissa Dawn Banks, female   DOB: 1981-02-28, 36 y.o.   MRN: [id number]  Chief Complaint  Patient presents with  . Routine Post Op    HPI Katie Banks is a 36 y.o. female here today for her post op gallbladder surgery done on 08/24/2017. Patient states she is doing well.  HPI  Past Medical History:  Diagnosis Date  . Cervicalgia   . Depression   . Family history of adverse reaction to anesthesia    MOM-N/V  . GERD (gastroesophageal reflux disease)    OCC- NO MEDS  . Headache    MIGRAINES    Past Surgical History:  Procedure Laterality Date  . APPENDECTOMY    . CHOLECYSTECTOMY N/A 08/24/2017   Procedure: LAPAROSCOPIC CHOLECYSTECTOMY WITH INTRAOPERATIVE CHOLANGIOGRAM;  Surgeon: Earline MayotteByrnett, Savi Lastinger W, MD;  Location: [facility] ORS;  Service: General;  Laterality: N/A;  . LAPAROSCOPIC LYSIS OF ADHESIONS  08/24/2017   Procedure: LAPAROSCOPIC LYSIS OF ADHESIONS;  Surgeon: Earline MayotteByrnett, Johnica Armwood W, MD;  Location: [facility] ORS;  Service: General;;    Family History  Problem Relation Age of Onset  . Breast cancer Mother        5437 and [id number]  . Ovarian cancer Mother   . Colon polyps Father   . Breast cancer Maternal Grandmother        8731 and 8951    Social History Social History   Tobacco Use  . Smoking status: Never Smoker  . Smokeless tobacco: Never Used  Substance Use Topics  . Alcohol use: No  . Drug use: No    No Known Allergies  Current Outpatient Medications  Medication Sig Dispense Refill  . acetaminophen (TYLENOL) 325 MG tablet Take 650 mg by mouth every 6 (six) hours as needed for moderate pain or headache.    Marland Kitchen. HYDROcodone-acetaminophen (NORCO) 5-325 MG tablet Take 1-2 tablets every 4 (four) hours as needed by mouth. Take no more than 10 tablets daily. 15 tablet 0  . ibuprofen (ADVIL,MOTRIN) 200 MG tablet Take 200 mg by mouth every 6 (six) hours as needed.    . naproxen sodium (ANAPROX) 220 MG tablet Take 440 mg by mouth 2 (two) times daily as needed (for pain or  headache).     No current facility-administered medications for this visit.     Review of Systems Review of Systems  Constitutional: Negative.   Respiratory: Negative.   Cardiovascular: Negative.     Blood pressure 130/80, pulse 86, resp. rate 14, height 5\' 2"  (1.575 m), weight 205 lb (93 kg), last menstrual period 08/16/2017.  Physical Exam Physical Exam  Constitutional: She is oriented to person, place, and time. She appears well-developed and well-nourished.  Abdominal:  Port site is clean and healing well.   Neurological: She is alert and oriented to person, place, and time.  Skin: Skin is warm and dry.    Data Reviewed DIAGNOSIS:  A. GALLBLADDER; CHOLECYSTECTOMY:  - CHRONIC CHOLECYSTITIS WITH CHOLELITHIASIS.  - CHOLESTEROLOSIS.  - NEGATIVE FOR MALIGNANCY.   Assessment    Doing well post cholecystectomy.    Plan    Proper lifting technique reviewed.    Patient to return as needed. The patient is aware to call back for any questions or concerns.   HPI, Physical Exam, Assessment and Plan have been scribed under the direction and in the presence of Donnalee CurryJeffrey Ervey Fallin, MD.  Ples SpecterJessica Qualls, CMA  I have completed the exam and reviewed the above documentation for accuracy and completeness.  I agree with the  above.  Museum/gallery conservatorDragon Technology has been used and any errors in dictation or transcription are unintentional.  Donnalee CurryJeffrey Letonia Stead, M.D., F.A.C.S.    Earline MayotteByrnett, Nirel Babler W 09/05/2017, 6:13 AM

## 2017-10-24 ENCOUNTER — Telehealth: Payer: Self-pay | Admitting: *Deleted

## 2017-10-24 NOTE — Telephone Encounter (Signed)
-----   Message from Earline MayotteJeffrey W Byrnett, MD sent at 10/19/2017  3:02 PM EST ----- The patient had genetic testing with [facility] in 2015. Results reviewed. No increased breast cancer risk, but increased risk of ovarian cancer. Has she had any discussion w/ GYN about ovary removal?Thanks.

## 2017-10-24 NOTE — Telephone Encounter (Signed)
She states she had talked with her GYN regarding surgery but had wanted to wait. She plans on discussing this with them again, appreciates call.

## 2018-01-01 DIAGNOSIS — R6889 Other general symptoms and signs: Secondary | ICD-10-CM | POA: Diagnosis not present

## 2018-01-01 DIAGNOSIS — J029 Acute pharyngitis, unspecified: Secondary | ICD-10-CM | POA: Diagnosis not present

## 2018-03-13 IMAGING — US US ABDOMEN COMPLETE
1 series · 13 of 25 positions shown · non-contrast
Comparison: None.

CLINICAL DATA: Epigastric abdominal pain and dyspepsia for the past
year, worse during the past 9 months.

EXAM:
ABDOMEN ULTRASOUND COMPLETE

[Series 1: us abdomen complete · 0.22mm/px · 13 of 100 slices shown]
[im 1/100]
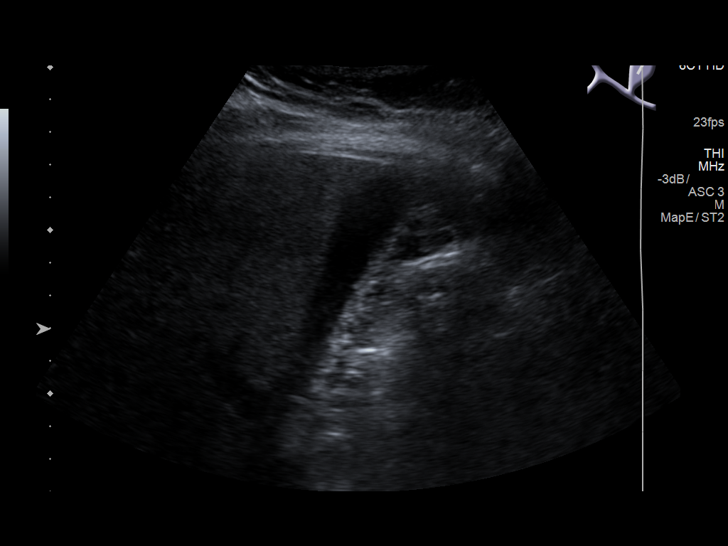
[im 9/100]
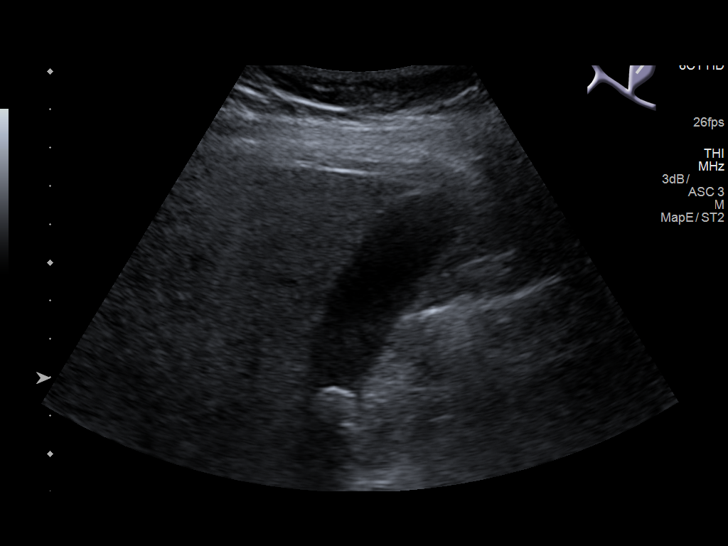
[im 17/100]
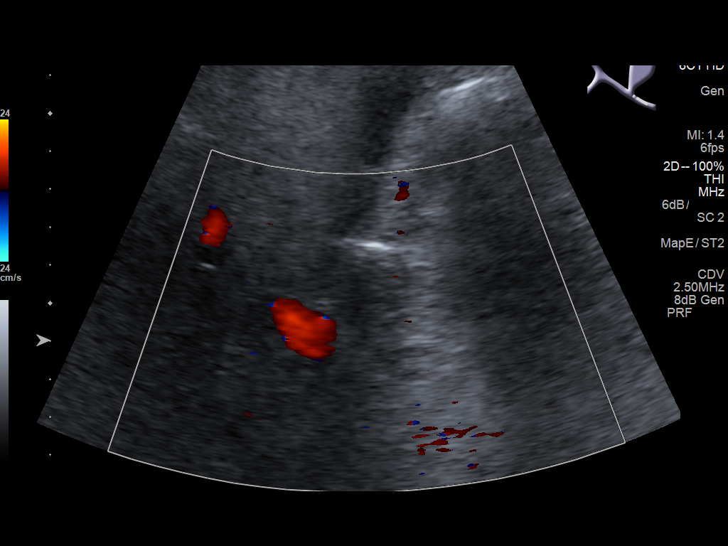
[im 25/100]
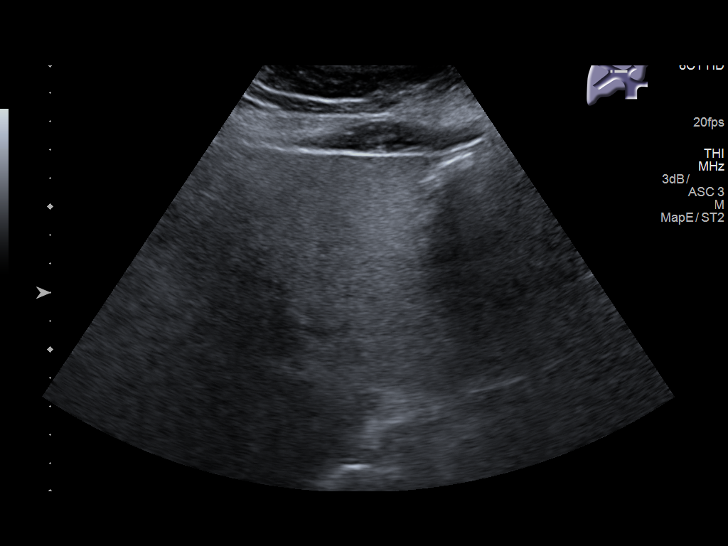
[im 34/100]
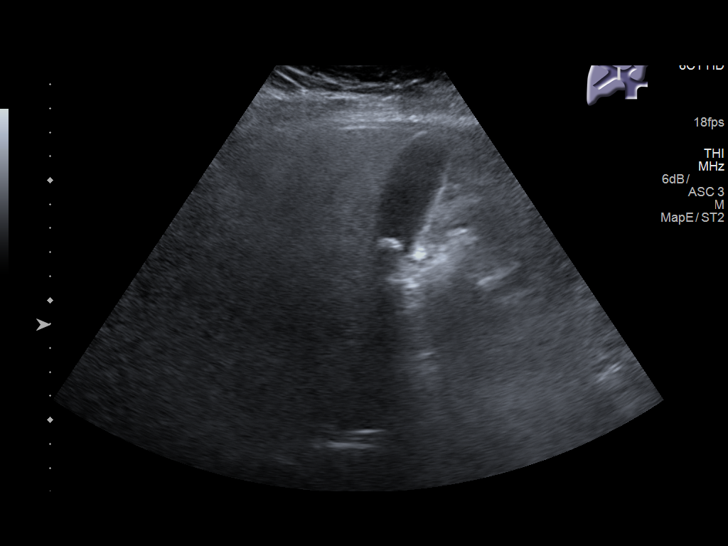
[im 42/100]
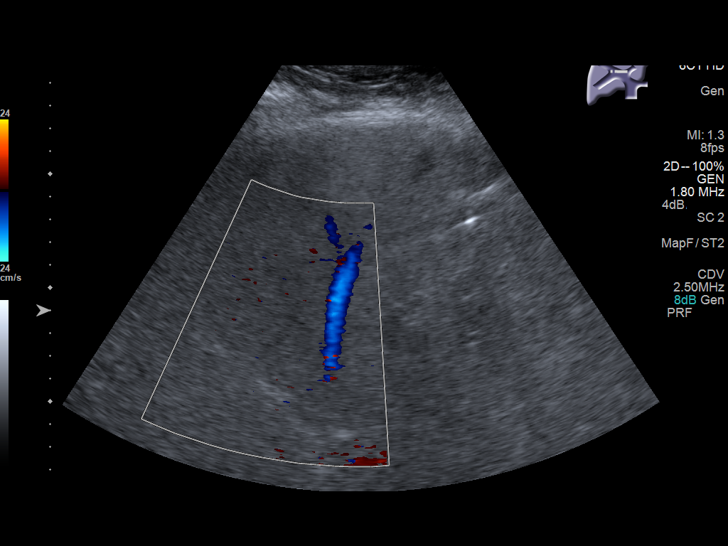
[im 50/100]
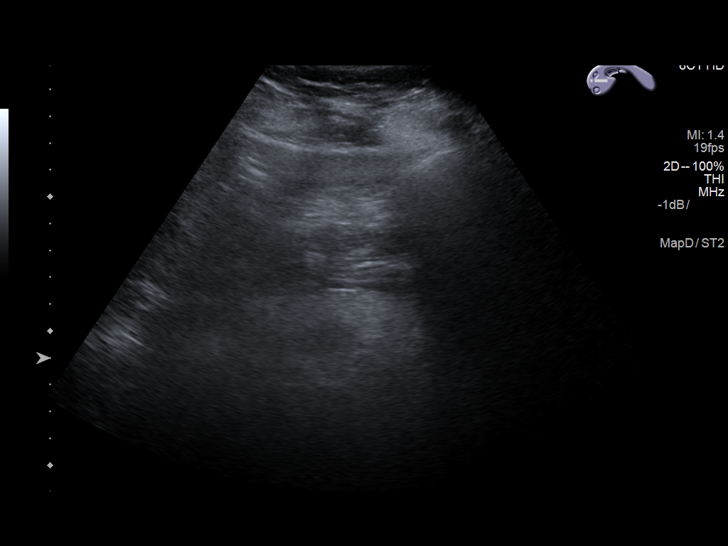
[im 58/100]
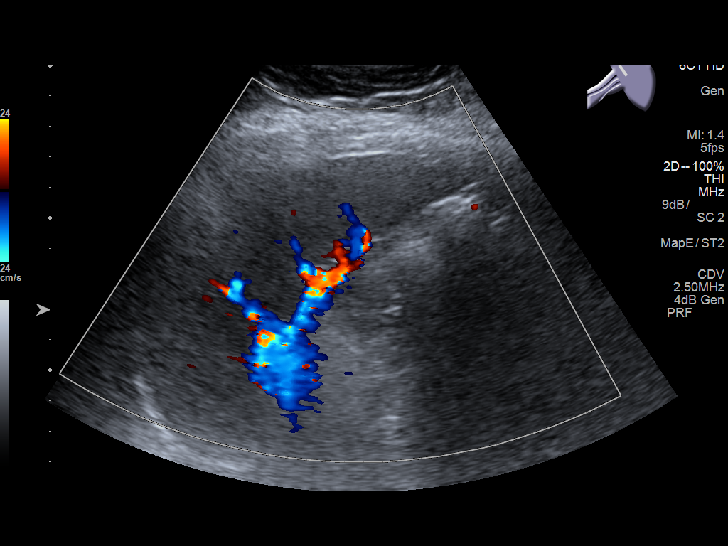
[im 67/100]
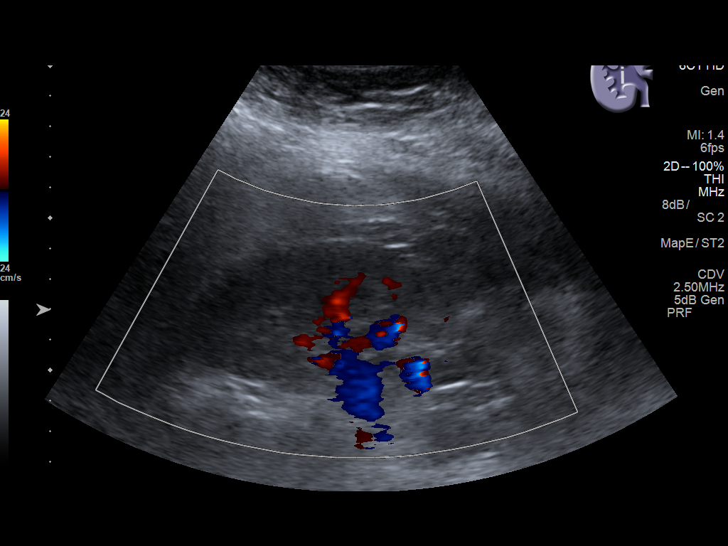
[im 75/100]
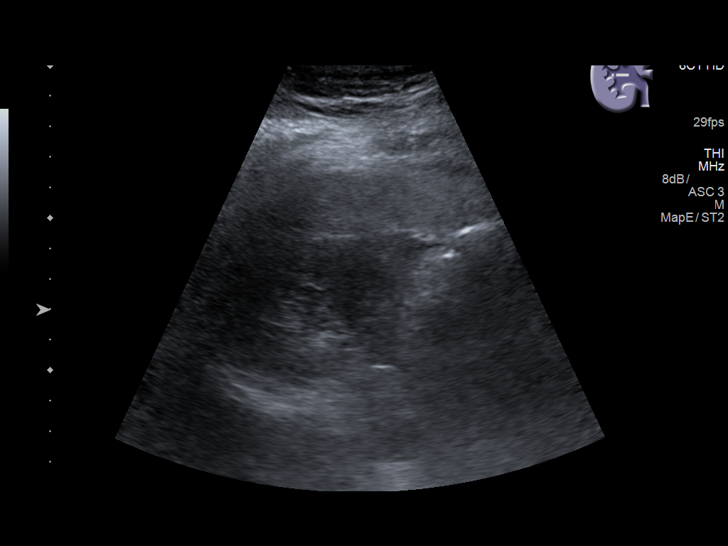
[im 83/100]
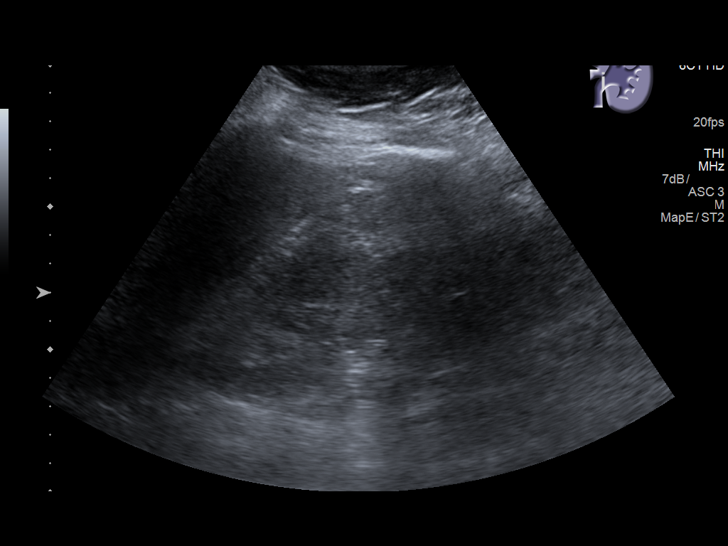
[im 91/100]
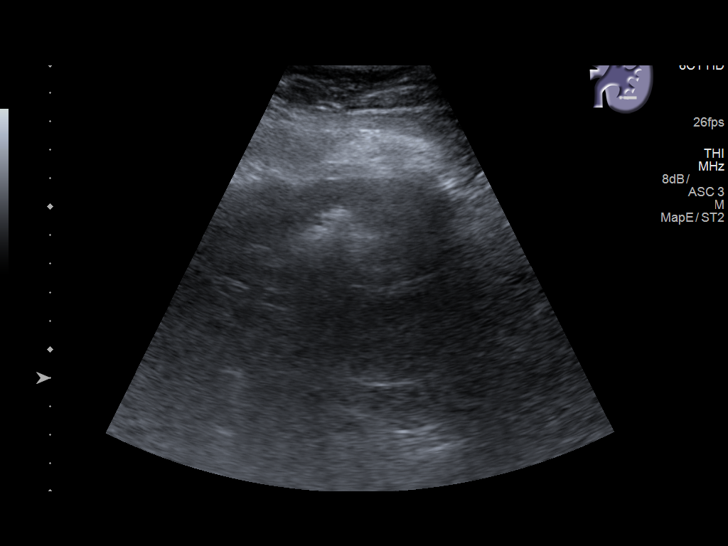
[im 100/100]
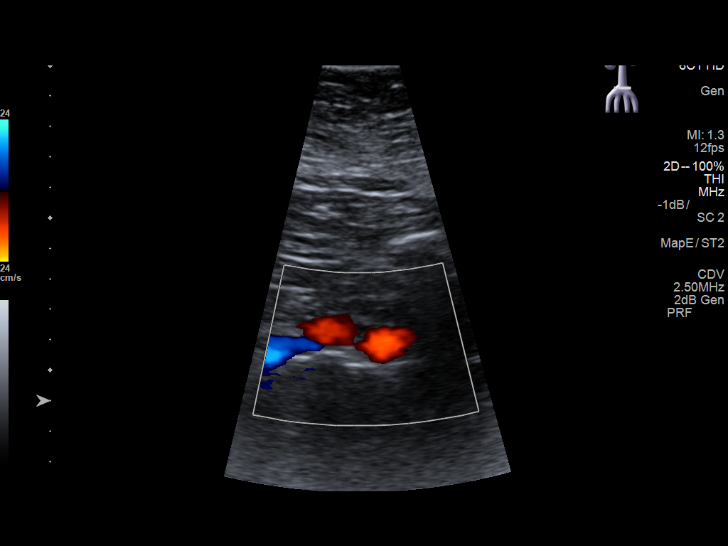

[13 of 25 positions shown; findings below may reference images not displayed]

FINDINGS: Examination is degraded secondary to patient body habitus and poor
sonographic window.

Gallbladder: There is a echogenic shadowing potentially nonmobile
stone measuring approximately 1.2 cm within the neck of an otherwise
normal gallbladder (image 5). No gallbladder wall thickening or
pericholecystic fluid. Negative sonographic Murphy's sign.

Common bile duct: Diameter: Normal in size measuring 3.2 mm in
diameter

Liver: There is diffuse increased slightly coarsened echogenicity of
the hepatic parenchyma suggestive of hepatic steatosis with
suspected minimal amount of focal fatty sparing adjacent to the
gallbladder fossa (image 5). No discrete hepatic lesions. No
intrahepatic bili duct dilatation. No ascites. Portal vein is patent
on color Doppler imaging with normal direction of blood flow towards
the liver.

IVC: No abnormality visualized.

Pancreas: Limited visualization of the pancreatic head and neck is
normal. Visualization of the pancreatic body and tail is obscured by
bowel gas.

Spleen: Normal in size measuring 9.5 cm in length

Right Kidney: Normal cortical thickness, echogenicity and size,
measuring 10.6 cm in length. No focal renal lesions. No echogenic
renal stones. No urinary obstruction.

Left Kidney: Normal cortical thickness, echogenicity and size,
measuring 11.2 cm in length. No focal renal lesions. No echogenic
renal stones. No urinary obstruction.

Abdominal aorta: No aneurysm visualized.

Other findings: None.
IMPRESSION: 1. Potentially nonmobile approximately 1.2 cm shadowing stone within
the neck of an otherwise normal-appearing gallbladder. If concern
persists for acute cholecystitis, further evaluation could be
performed with nuclear medicine HIDA scan as clinically indicated.
2. Suspected hepatic steatosis. Correlation with LFTs is
recommended.

## 2018-07-22 ENCOUNTER — Ambulatory Visit (INDEPENDENT_AMBULATORY_CARE_PROVIDER_SITE_OTHER): Payer: 59 | Admitting: Obstetrics & Gynecology

## 2018-07-22 ENCOUNTER — Other Ambulatory Visit: Payer: Self-pay | Admitting: Obstetrics & Gynecology

## 2018-07-22 ENCOUNTER — Encounter: Payer: Self-pay | Admitting: Obstetrics & Gynecology

## 2018-07-22 VITALS — BP 140/82 | Ht 61.0 in | Wt 206.0 lb

## 2018-07-22 DIAGNOSIS — Z0000 Encounter for general adult medical examination without abnormal findings: Secondary | ICD-10-CM

## 2018-07-22 DIAGNOSIS — Z1321 Encounter for screening for nutritional disorder: Secondary | ICD-10-CM

## 2018-07-22 DIAGNOSIS — Z1329 Encounter for screening for other suspected endocrine disorder: Secondary | ICD-10-CM | POA: Diagnosis not present

## 2018-07-22 DIAGNOSIS — Z148 Genetic carrier of other disease: Secondary | ICD-10-CM | POA: Diagnosis not present

## 2018-07-22 DIAGNOSIS — Z124 Encounter for screening for malignant neoplasm of cervix: Secondary | ICD-10-CM

## 2018-07-22 DIAGNOSIS — Z01419 Encounter for gynecological examination (general) (routine) without abnormal findings: Secondary | ICD-10-CM

## 2018-07-22 DIAGNOSIS — Z131 Encounter for screening for diabetes mellitus: Secondary | ICD-10-CM | POA: Diagnosis not present

## 2018-07-22 DIAGNOSIS — Z1239 Encounter for other screening for malignant neoplasm of breast: Secondary | ICD-10-CM

## 2018-07-22 NOTE — Progress Notes (Signed)
HPI:      [name] is a 37 y.o. G3P3000 who LMP was Patient's last menstrual period was 06/30/2018., she presents today for her annual examination. The patient has no complaints today. The patient is sexually active. Her last pap: approximate date 2016 and was normal. The patient does perform self breast exams.  There is notable family history of breast or ovarian cancer in her family.  The patient has regular exercise: yes.  The patient denies current symptoms of depression.    Pt had MyRisk in past due to mother with breast AND ovarian cancer    RAD51C variant.  Increased risk of ovarian cancer, 6.5%         Also increased risk of breast cancer.     TC model 10.1 % at that time (2015)  GYN History: Contraception: vasectomy  PMHx: Past Medical History:  Diagnosis Date  . Cervicalgia   . Depression   . Family history of adverse reaction to anesthesia    MOM-N/V  . GERD (gastroesophageal reflux disease)    OCC- NO MEDS  . Headache    MIGRAINES   Past Surgical History:  Procedure Laterality Date  . APPENDECTOMY    . CHOLECYSTECTOMY N/A 08/24/2017   Procedure: LAPAROSCOPIC CHOLECYSTECTOMY WITH INTRAOPERATIVE CHOLANGIOGRAM;  Surgeon: Abdinasir Spadafore Bellow, MD;  Location: [facility] ORS;  Service: General;  Laterality: N/A;  . LAPAROSCOPIC LYSIS OF ADHESIONS  08/24/2017   Procedure: LAPAROSCOPIC LYSIS OF ADHESIONS;  Surgeon: Idalis Hoelting Bellow, MD;  Location: [facility] ORS;  Service: General;;   Family History  Problem Relation Age of Onset  . Breast cancer Mother        59 and 40  . Ovarian cancer Mother   . Colon polyps Father   . Breast cancer Maternal Grandmother        85 and 67   Social History   Tobacco Use  . Smoking status: Never Smoker  . Smokeless tobacco: Never Used  Substance Use Topics  . Alcohol use: No  . Drug use: No    Current Outpatient Medications:  .  acetaminophen (TYLENOL) 325 MG tablet, Take 650 mg by mouth every 6 (six) hours as needed for  moderate pain or headache., Disp: , Rfl:  .  HYDROcodone-acetaminophen (NORCO) 5-325 MG tablet, Take 1-2 tablets every 4 (four) hours as needed by mouth. Take no more than 10 tablets daily. (Patient not taking: Reported on 07/22/2018), Disp: 15 tablet, Rfl: 0 .  ibuprofen (ADVIL,MOTRIN) 200 MG tablet, Take 200 mg by mouth every 6 (six) hours as needed., Disp: , Rfl:  .  naproxen sodium (ANAPROX) 220 MG tablet, Take 440 mg by mouth 2 (two) times daily as needed (for pain or headache)., Disp: , Rfl:  Allergies: Patient has no known allergies.  Review of Systems  Constitutional: Negative for chills, fever and malaise/fatigue.  HENT: Negative for congestion, sinus pain and sore throat.   Eyes: Negative for blurred vision and pain.  Respiratory: Negative for cough and wheezing.   Cardiovascular: Negative for chest pain and leg swelling.  Gastrointestinal: Negative for abdominal pain, constipation, diarrhea, heartburn, nausea and vomiting.  Genitourinary: Negative for dysuria, frequency, hematuria and urgency.  Musculoskeletal: Negative for back pain, joint pain, myalgias and neck pain.  Skin: Negative for itching and rash.  Neurological: Negative for dizziness, tremors and weakness.  Endo/Heme/Allergies: Does not bruise/bleed easily.  Psychiatric/Behavioral: Negative for depression. The patient is not nervous/anxious and does not have insomnia.  Objective: BP 140/82   Ht 5' 1"  (1.549 m)   Wt 206 lb (93.4 kg)   LMP 06/30/2018   BMI 38.92 kg/m   Filed Weights   07/22/18 1531  Weight: 206 lb (93.4 kg)   Body mass index is 38.92 kg/m. Physical Exam  Constitutional: She is oriented to person, place, and time. She appears well-developed and well-nourished. No distress.  Genitourinary: Rectum normal, vagina normal and uterus normal. Pelvic exam was performed with patient supine. There is no rash or lesion on the right labia. There is no rash or lesion on the left labia. Vagina exhibits no  lesion. No bleeding in the vagina. Right adnexum does not display mass and does not display tenderness. Left adnexum does not display mass and does not display tenderness. Cervix does not exhibit motion tenderness, lesion, friability or polyp.   Uterus is mobile and midaxial. Uterus is not enlarged or exhibiting a mass.  HENT:  Head: Normocephalic and atraumatic. Head is without laceration.  Right Ear: Hearing normal.  Left Ear: Hearing normal.  Nose: No epistaxis.  No foreign bodies.  Mouth/Throat: Uvula is midline, oropharynx is clear and moist and mucous membranes are normal.  Eyes: Pupils are equal, round, and reactive to light.  Neck: Normal range of motion. Neck supple. No thyromegaly present.  Cardiovascular: Normal rate and regular rhythm. Exam reveals no gallop and no friction rub.  No murmur heard. Pulmonary/Chest: Effort normal and breath sounds normal. No respiratory distress. She has no wheezes. Right breast exhibits no mass, no skin change and no tenderness. Left breast exhibits no mass, no skin change and no tenderness.  Abdominal: Soft. Bowel sounds are normal. She exhibits no distension. There is no tenderness. There is no rebound.  Musculoskeletal: Normal range of motion.  Neurological: She is alert and oriented to person, place, and time. No cranial nerve deficit.  Skin: Skin is warm and dry.  Psychiatric: She has a normal mood and affect. Judgment normal.  Vitals reviewed.   Assessment:  ANNUAL EXAM 1. Annual physical exam   2. Screening for cervical cancer   3. Carrier of gene mutation for high risk of cancer   4. Screening for breast cancer   5. Screening for thyroid disorder   6. Screening for diabetes mellitus   7. Encounter for vitamin deficiency screening      Screening Plan:            1.  Cervical Screening-  Pap smear done today  2. Breast screening- Exam annually and mammogram>40 planned   3. Colonoscopy every 10 years, Hemoccult testing - after  age 7  4. Labs Ordered today  5. Counseling for contraception: vasectomy  Other:  1. Annual physical exam  2. Screening for cervical cancer  3. Carrier of gene mutation for high risk of cancer Variant. Recs- none specifically for ovarian cancer, can consider periodic [place] and/or CA125 - CA 125 - US PELVIS TRANSVANGINAL NON-OB (TV ONLY); Future - MM 3D SCREEN BREAST BILATERAL; Future  4. Screening for breast cancer Variant.  MMG and MRI recommended.  Breast exams annual. - MM 3D SCREEN BREAST BILATERAL; Future - consider MRI next year  5. Screening for thyroid disorder - TSH  6. Screening for diabetes mellitus - Hemoglobin A1c  7. Encounter for vitamin deficiency screening - VITAMIN D 25 Hydroxy (Vit-D Deficiency, Fractures)    F/U  Return in about 1 year (around 07/23/2019) for Annual.  Barnett Applebaum, MD, [facility], [facility] 07/22/2018  4:23 PM

## 2018-07-22 NOTE — Patient Instructions (Addendum)
PAP every three years Mammogram every year    Call [phone] to schedule at [facility] yearly (with PCP)  [place] to assess ovaries CA 125 lab for ovarian cancer MMG and MRI to assess breast

## 2018-07-23 LAB — CA 125: Cancer Antigen (CA) 125: 10.5 U/mL (ref 0.0–38.1)

## 2018-07-24 ENCOUNTER — Telehealth: Payer: Self-pay | Admitting: Obstetrics & Gynecology

## 2018-07-24 ENCOUNTER — Telehealth: Payer: Self-pay

## 2018-07-24 ENCOUNTER — Encounter: Payer: Self-pay | Admitting: Obstetrics and Gynecology

## 2018-07-24 NOTE — Telephone Encounter (Signed)
I don't see where you ordered this pap. Can you put the order in.

## 2018-07-24 NOTE — Telephone Encounter (Signed)
Patient is schedule 07/29/18 with [facility]

## 2018-07-24 NOTE — Telephone Encounter (Signed)
Done

## 2018-07-24 NOTE — Telephone Encounter (Signed)
Kim from [place] is calling in regards to the pap that was done by [employer]. She is asking for the Test # and the source of the pap. CB#[phone]

## 2018-07-24 NOTE — Telephone Encounter (Signed)
-----   Message from Nadara Mustard, MD sent at 07/23/2018  8:12 AM EDT ----- Schedule GYN [place] and f/u PH (should have been done after appt but I dont see it).

## 2018-07-24 NOTE — Addendum Note (Signed)
Addended by: Nadara Mustard on: 07/24/2018 11:49 AM   Modules accepted: Orders

## 2018-07-26 LAB — IGP, APTIMA HPV
HPV APTIMA: NEGATIVE
PAP Smear Comment: 0

## 2018-07-29 ENCOUNTER — Ambulatory Visit (INDEPENDENT_AMBULATORY_CARE_PROVIDER_SITE_OTHER): Payer: 59

## 2018-07-29 ENCOUNTER — Ambulatory Visit (INDEPENDENT_AMBULATORY_CARE_PROVIDER_SITE_OTHER): Payer: 59 | Admitting: Obstetrics & Gynecology

## 2018-07-29 ENCOUNTER — Encounter: Payer: Self-pay | Admitting: Obstetrics & Gynecology

## 2018-07-29 VITALS — BP 120/80 | Ht 61.0 in | Wt 204.0 lb

## 2018-07-29 DIAGNOSIS — Z131 Encounter for screening for diabetes mellitus: Secondary | ICD-10-CM | POA: Diagnosis not present

## 2018-07-29 DIAGNOSIS — Z1329 Encounter for screening for other suspected endocrine disorder: Secondary | ICD-10-CM | POA: Diagnosis not present

## 2018-07-29 DIAGNOSIS — Z148 Genetic carrier of other disease: Secondary | ICD-10-CM

## 2018-07-29 DIAGNOSIS — Z8041 Family history of malignant neoplasm of ovary: Secondary | ICD-10-CM | POA: Insufficient documentation

## 2018-07-29 DIAGNOSIS — Z1502 Genetic susceptibility to malignant neoplasm of ovary: Secondary | ICD-10-CM

## 2018-07-29 DIAGNOSIS — Z803 Family history of malignant neoplasm of breast: Secondary | ICD-10-CM

## 2018-07-29 DIAGNOSIS — Z1321 Encounter for screening for nutritional disorder: Secondary | ICD-10-CM | POA: Diagnosis not present

## 2018-07-29 NOTE — Progress Notes (Signed)
  HPI: Pt has genetic risk factors for ovarian cancer. Recent CA125 normal. PAP also normal.  No sx's, discussed screening pros and cons of [place] for ovarian cancer diagnosis.  Ultrasound demonstrates no masses seen, normal ovaries.  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 Also,  has a past surgical history that includes Appendectomy; Cholecystectomy (N/A, 08/24/2017); and Laparoscopic lysis of adhesions (08/24/2017)., family history includes Breast cancer in her maternal grandmother and mother; Colon polyps in her father; Ovarian cancer (age of onset: 82) in her mother.,  reports that she has never smoked. She has never used smokeless tobacco. She reports that she does not drink alcohol or use drugs.  She has a current medication list which includes the following prescription(s): acetaminophen, hydrocodone-acetaminophen, ibuprofen, and naproxen sodium. Also, has No Known Allergies.  Review of Systems  All other systems reviewed and are negative.  Objective: BP 120/80   Ht _0  (1.549 m)   Wt 204 lb (92.5 kg)   LMP 06/30/2018   BMI 38.55 kg/m   Physical examination Constitutional NAD, Conversant  Skin No rashes, lesions or ulceration.   Extremities: Moves all appropriately.  Normal ROM for age. No lymphadenopathy.  Neuro: Grossly intact  Psych: Oriented to PPT.  Normal mood. Normal affect.   Assessment:  Family history of breast cancer  Family history of ovarian cancer  Review of ULTRASOUND.    I have personally reviewed images and report of recent ultrasound done at [facility].    Plan of management to be discussed with patient.  Labs today as were not done last visit  A total of  15 minutes were spent face-to-face with the patient during this encounter and over half of that time dealt with counseling and coordination of care.  Barnett Applebaum, MD, [facility], [facility] 07/29/2018  2:55 PM

## 2018-07-30 LAB — HEMOGLOBIN A1C
ESTIMATED AVERAGE GLUCOSE: 108 mg/dL
HEMOGLOBIN A1C: 5.4 % (ref 4.8–5.6)

## 2018-07-30 LAB — VITAMIN D 25 HYDROXY (VIT D DEFICIENCY, FRACTURES): VIT D 25 HYDROXY: 21.1 ng/mL — AB (ref 30.0–100.0)

## 2018-07-30 LAB — TSH: TSH: 2.38 u[IU]/mL (ref 0.450–4.500)

## 2018-10-28 ENCOUNTER — Telehealth: Payer: Self-pay

## 2018-10-28 NOTE — Telephone Encounter (Signed)
Called pt left message to remind pt to schedule her mammo.

## 2018-10-28 NOTE — Telephone Encounter (Signed)
-----   Message from Nadara Mustard, MD sent at 10/28/2018 10:07 AM EST ----- Regarding: MMG1 Received notice she has not received MMG yet as ordered at her Annual. Please check and encourage her to do this, and document conversation.

## 2018-12-08 IMAGING — NM NM HEPATOBILIARY IMAGE, INC GB
1 series · 6 of 6 positions shown · non-contrast
Comparison: None.

CLINICAL DATA: Right upper quadrant pain.  Nausea without vomiting.

EXAM:
NUCLEAR MEDICINE HEPATOBILIARY IMAGING
TECHNIQUE: Sequential images of the abdomen were obtained [DATE] minutes
following intravenous administration of radiopharmaceutical.
RADIOPHARMACEUTICALS:  5.45 mCi Nc-BBm  Choletec IV

[Series 1000: hepatobiliary scan · 9.59mm/px · 6 of 60 frames shown]
[frame 6/60]
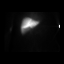
[frame 16/60]
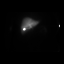
[frame 26/60]
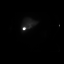
[frame 36/60]
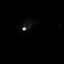
[frame 46/60]
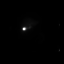
[frame 56/60]
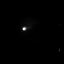

[6 of 6 positions shown; findings below may reference images not displayed]

FINDINGS: Prompt uptake and biliary excretion of activity by the liver is
seen. Gallbladder activity is visualized, consistent with patency of
cystic duct. Biliary activity passes into small bowel, consistent
with patent common bile duct.
IMPRESSION: Normal study. Normal filling of the gallbladder and normal
excretion.

## 2019-11-03 ENCOUNTER — Ambulatory Visit (INDEPENDENT_AMBULATORY_CARE_PROVIDER_SITE_OTHER): Payer: Commercial Managed Care - PPO | Admitting: Obstetrics & Gynecology

## 2019-11-03 ENCOUNTER — Other Ambulatory Visit: Payer: Self-pay

## 2019-11-03 ENCOUNTER — Encounter: Payer: Self-pay | Admitting: Obstetrics & Gynecology

## 2019-11-03 VITALS — BP 128/80 | Ht 61.0 in | Wt 189.0 lb

## 2019-11-03 DIAGNOSIS — Z01419 Encounter for gynecological examination (general) (routine) without abnormal findings: Secondary | ICD-10-CM

## 2019-11-03 DIAGNOSIS — Z803 Family history of malignant neoplasm of breast: Secondary | ICD-10-CM

## 2019-11-03 NOTE — Patient Instructions (Signed)
PAP every three years Mammogram every year    Call [phone] to schedule at [facility] next year

## 2019-11-03 NOTE — Progress Notes (Signed)
HPI:      [name] is a 39 y.o. G3P3000 who LMP was Patient's last menstrual period was 11/03/2019., she presents today for her annual examination. The patient has no complaints today. The patient is sexually active. Her last pap: approximate date 2019 and was normal and last mammogram: approximate date 2015 (for a mass then, benign) and was normal. The patient does perform self breast exams.  There is notable family history of breast or ovarian cancer in her family. BREAST CANCER YOUNG AGE.  BRCA NEG, but POS for VARIANT that increases RISK FOR OVARIAN CANCER.   [place] last year normal, denies bloating pain weight gain.  The patient has regular exercise: yes.  The patient denies current symptoms of depression.    GYN History: Contraception: vasectomy  PMHx: Past Medical History:  Diagnosis Date  . Cervicalgia   . Depression   . Family history of adverse reaction to anesthesia    MOM-N/V  . Family history of breast cancer    IBIS=20.4% 3/16  . Genetic testing 08/2014   RAD51C POSITIVE (increased ovar ca risk--6.7% to age 28)  . GERD (gastroesophageal reflux disease)    OCC- NO MEDS  . Headache    MIGRAINES  . Increased risk of breast cancer    IBIS=20.4%  . 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 Ovarian cancer genetic susceptibility    RAD51C pos; consider BSO age 72-50   Past Surgical History:  Procedure Laterality Date  . APPENDECTOMY    . CHOLECYSTECTOMY N/A 08/24/2017   Procedure: LAPAROSCOPIC CHOLECYSTECTOMY WITH INTRAOPERATIVE CHOLANGIOGRAM;  Surgeon: Joandy Burget Bellow, MD;  Location: [facility] ORS;  Service: General;  Laterality: N/A;  . LAPAROSCOPIC LYSIS OF ADHESIONS  08/24/2017   Procedure: LAPAROSCOPIC LYSIS OF ADHESIONS;  Surgeon: Kalie Cabral Bellow, MD;  Location: [facility] ORS;  Service: General;;   Family History  Problem Relation Age of Onset  . Breast cancer Mother        28 and 17  . Ovarian cancer Mother 53  . Colon polyps  Father   . Breast cancer Maternal Grandmother        68 and 40   Social History   Tobacco Use  . Smoking status: Never Smoker  . Smokeless tobacco: Never Used  Substance Use Topics  . Alcohol use: No  . Drug use: No    Current Outpatient Medications:  .  acetaminophen (TYLENOL) 325 MG tablet, Take 650 mg by mouth every 6 (six) hours as needed for moderate pain or headache., Disp: , Rfl:  .  HYDROcodone-acetaminophen (NORCO) 5-325 MG tablet, Take 1-2 tablets every 4 (four) hours as needed by mouth. Take no more than 10 tablets daily. (Patient not taking: Reported on 07/22/2018), Disp: 15 tablet, Rfl: 0 .  ibuprofen (ADVIL,MOTRIN) 200 MG tablet, Take 200 mg by mouth every 6 (six) hours as needed., Disp: , Rfl:  .  naproxen sodium (ANAPROX) 220 MG tablet, Take 440 mg by mouth 2 (two) times daily as needed (for pain or headache)., Disp: , Rfl:  Allergies: Patient has no known allergies.  Review of Systems  Constitutional: Negative for chills, fever and malaise/fatigue.  HENT: Negative for congestion, sinus pain and sore throat.   Eyes: Negative for blurred vision and pain.  Respiratory: Negative for cough and wheezing.   Cardiovascular: Negative for chest pain and leg swelling.  Gastrointestinal: Negative for abdominal pain, constipation, diarrhea, heartburn, nausea and vomiting.  Genitourinary: Negative for  dysuria, frequency, hematuria and urgency.  Musculoskeletal: Negative for back pain, joint pain, myalgias and neck pain.  Skin: Negative for itching and rash.  Neurological: Negative for dizziness, tremors and weakness.  Endo/Heme/Allergies: Does not bruise/bleed easily.  Psychiatric/Behavioral: Negative for depression. The patient is not nervous/anxious and does not have insomnia.     Objective: BP 128/80   Ht '5\' 1"'$  (1.549 m)   Wt 189 lb (85.7 kg)   LMP 11/03/2019   BMI 35.71 kg/m   Filed Weights   11/03/19 1425  Weight: 189 lb (85.7 kg)   Body mass index is 35.71  kg/m. Physical Exam Constitutional:      General: She is not in acute distress.    Appearance: She is well-developed.  Genitourinary:     Pelvic exam was performed with patient supine.     Vagina, uterus and rectum normal.     No lesions in the vagina.     No vaginal bleeding.     No cervical motion tenderness, friability, lesion or polyp.     Uterus is mobile.     Uterus is not enlarged.     No uterine mass detected.    Uterus is midaxial.     No right or left adnexal mass present.     Right adnexa not tender.     Left adnexa not tender.  HENT:     Head: Normocephalic and atraumatic. No laceration.     Right Ear: Hearing normal.     Left Ear: Hearing normal.     Mouth/Throat:     Pharynx: Uvula midline.  Eyes:     Pupils: Pupils are equal, round, and reactive to light.  Neck:     Thyroid: No thyromegaly.  Cardiovascular:     Rate and Rhythm: Normal rate and regular rhythm.     Heart sounds: No murmur. No friction rub. No gallop.   Pulmonary:     Effort: Pulmonary effort is normal. No respiratory distress.     Breath sounds: Normal breath sounds. No wheezing.  Chest:     Breasts:        Right: No mass, skin change or tenderness.        Left: No mass, skin change or tenderness.  Abdominal:     General: Bowel sounds are normal. There is no distension.     Palpations: Abdomen is soft.     Tenderness: There is no abdominal tenderness. There is no rebound.  Musculoskeletal:        General: Normal range of motion.     Cervical back: Normal range of motion and neck supple.  Neurological:     Mental Status: She is alert and oriented to person, place, and time.     Cranial Nerves: No cranial nerve deficit.  Skin:    General: Skin is warm and dry.  Psychiatric:        Judgment: Judgment normal.  Vitals reviewed.     Assessment:  ANNUAL EXAM 1. Women's annual routine gynecological examination   2. Family history of breast cancer     Screening Plan:            1.   Cervical Screening-  Pap smear schedule reviewed with patient  2. Breast screening- Exam annually and mammogram>40 planned   3. Ovarian cancer risk [place] normal last year Counseled that there is no absolute screening procedure Can periodically check [place] Monitor for symptoms  4. Labs next year  Vit D advised daily (low  last year)  5. Counseling for contraception: vasectomy    F/U  Return in about 1 year (around 11/02/2020) for Annual.  Barnett Applebaum, MD, [facility], [facility] 11/03/2019  2:48 PM

## 2020-01-27 ENCOUNTER — Other Ambulatory Visit: Payer: Self-pay | Admitting: Obstetrics & Gynecology

## 2020-01-28 ENCOUNTER — Telehealth: Payer: Self-pay

## 2020-01-28 NOTE — Telephone Encounter (Signed)
Pt aware to schedule her mammogram 

## 2020-01-28 NOTE — Telephone Encounter (Signed)
-----   Message from Nadara Mustard, MD sent at 01/27/2020  7:54 AM EDT ----- Regarding: MMG ck? Received notice she has not received MMG yet as ordered at her Annual. Please check and encourage her to do this, and document conversation.

## 2020-03-05 ENCOUNTER — Ambulatory Visit
Admission: RE | Admit: 2020-03-05 | Discharge: 2020-03-05 | Disposition: A | Discharge status: Home / Self Care | Payer: Commercial Managed Care - PPO | Source: Ambulatory Visit | Attending: Obstetrics & Gynecology | Admitting: Obstetrics & Gynecology

## 2020-03-05 DIAGNOSIS — Z803 Family history of malignant neoplasm of breast: Secondary | ICD-10-CM | POA: Insufficient documentation

## 2020-03-05 DIAGNOSIS — Z1231 Encounter for screening mammogram for malignant neoplasm of breast: Secondary | ICD-10-CM | POA: Insufficient documentation

## 2020-08-02 ENCOUNTER — Ambulatory Visit: Payer: Commercial Managed Care - PPO | Admitting: Obstetrics & Gynecology

## 2020-12-22 ENCOUNTER — Ambulatory Visit: Payer: Commercial Managed Care - PPO | Admitting: Obstetrics & Gynecology

## 2021-01-17 ENCOUNTER — Other Ambulatory Visit (HOSPITAL_COMMUNITY)
Admission: RE | Admit: 2021-01-17 | Discharge: 2021-01-17 | Disposition: A | Discharge status: Home / Self Care | Payer: Commercial Managed Care - PPO | Source: Ambulatory Visit | Attending: Obstetrics & Gynecology | Admitting: Obstetrics & Gynecology

## 2021-01-17 ENCOUNTER — Encounter: Payer: Self-pay | Admitting: Obstetrics & Gynecology

## 2021-01-17 ENCOUNTER — Other Ambulatory Visit: Payer: Self-pay

## 2021-01-17 ENCOUNTER — Ambulatory Visit (INDEPENDENT_AMBULATORY_CARE_PROVIDER_SITE_OTHER): Payer: Commercial Managed Care - PPO | Admitting: Obstetrics & Gynecology

## 2021-01-17 VITALS — BP 120/82 | Ht 61.0 in | Wt 196.0 lb

## 2021-01-17 DIAGNOSIS — Z124 Encounter for screening for malignant neoplasm of cervix: Secondary | ICD-10-CM | POA: Diagnosis present

## 2021-01-17 DIAGNOSIS — Z01419 Encounter for gynecological examination (general) (routine) without abnormal findings: Secondary | ICD-10-CM | POA: Diagnosis not present

## 2021-01-17 DIAGNOSIS — Z8041 Family history of malignant neoplasm of ovary: Secondary | ICD-10-CM | POA: Diagnosis not present

## 2021-01-17 DIAGNOSIS — Z1231 Encounter for screening mammogram for malignant neoplasm of breast: Secondary | ICD-10-CM | POA: Diagnosis not present

## 2021-01-17 DIAGNOSIS — Z803 Family history of malignant neoplasm of breast: Secondary | ICD-10-CM | POA: Diagnosis not present

## 2021-01-17 NOTE — Addendum Note (Signed)
Addended by: Nadara Mustard on: 01/17/2021 04:21 PM   Modules accepted: Orders

## 2021-01-17 NOTE — Patient Instructions (Addendum)
PAP every three years Mammogram every year  Thank you for choosing [facility]. As part of our ongoing efforts to improve patient experience, we would appreciate your feedback. Please fill out the short survey that you will receive by mail or MyChart. Your opinion is important to [place]! - Dr. Tiburcio Pea   Total Laparoscopic Hysterectomy A total laparoscopic hysterectomy is a minimally invasive surgery to remove the uterus and cervix. The fallopian tubes and ovaries can also be removed during this surgery, if necessary. This procedure may be done to treat problems such as:  Growths in the uterus (uterine fibroids) that are not cancer but cause symptoms.  A condition that causes the lining of the uterus to grow in other areas (endometriosis).  Problems with pelvic support.  Cancer of the cervix, ovaries, uterus, or tissue that lines the uterus (endometrium).  Excessive bleeding in the uterus. After this procedure, you will no longer be able to have a baby, and you will no longer have a menstrual period. Tell a health care provider about:  Any allergies you have.  All medicines you are taking, including vitamins, herbs, eye drops, creams, and over-the-counter medicines.  Any problems you or family members have had with anesthetic medicines.  Any blood disorders you have.  Any surgeries you have had.  Any medical conditions you have.  Whether you are pregnant or may be pregnant. What are the risks? Generally, this is a safe procedure. However, problems may occur, including:  Infection.  Bleeding.  Blood clots in the legs or lungs.  Allergic reactions to medicines.  Damage to nearby structures or organs.  Having to change from this surgery to one in which a large incision is made in the abdomen (abdominal hysterectomy). What happens before the procedure? Staying hydrated Follow instructions from your health care provider about hydration, which may include:  Up to 2 hours  before the procedure - you may continue to drink clear liquids, such as water, clear fruit juice, black coffee, and plain tea.   Eating and drinking restrictions Follow instructions from your health care provider about eating and drinking, which may include:  8 hours before the procedure - stop eating heavy meals or foods, such as meat, fried foods, or fatty foods.  6 hours before the procedure - stop eating light meals or foods, such as toast or cereal.  6 hours before the procedure - stop drinking milk or drinks that contain milk.  2 hours before the procedure - stop drinking clear liquids. Medicines  Ask your health care provider about: ? Changing or stopping your regular medicines. This is especially important if you are taking diabetes medicines or blood thinners. ? Taking medicines such as aspirin and ibuprofen. These medicines can thin your blood. Do not take these medicines unless your health care provider tells you to take them. ? Taking over-the-counter medicines, vitamins, herbs, and supplements.  You may be asked to take medicine that helps you have a bowel movement (laxative) to prevent constipation. General instructions  If you were asked to do bowel preparation before the procedure, follow instructions from your health care provider.  This procedure can affect the way you feel about yourself. Talk with your health care provider about the physical and emotional changes hysterectomy may cause.  Do not use any products that contain nicotine or tobacco for at least 4 weeks before the procedure. These products include cigarettes, chewing tobacco, and vaping devices, such as e-cigarettes. If you need help quitting, ask your health  care provider.  Plan to have a responsible adult take you home from the hospital or clinic.  Plan to have a responsible adult care for you for the time you are told after you leave the hospital or clinic. This is important. Surgery safety Ask your  health care provider:  How your surgery site will be marked.  What steps will be taken to help prevent infection. These may include: ? Removing hair at the surgery site. ? Washing skin with a germ-killing soap. ? Receiving antibiotic medicine. What happens during the procedure?  An IV will be inserted into one of your veins.  You will be given one or more of the following: ? A medicine to help you relax (sedative). ? A medicine to make you fall asleep (general anesthetic). ? A medicine to numb the area (local anesthetic). ? A medicine that is injected into your spine to numb the area below and slightly above the injection site (spinal anesthetic). ? A medicine that is injected into an area of your body to numb everything below the injection site (regional anesthetic).  A gas will be used to inflate your abdomen. This will allow your surgeon to look inside your abdomen and do the surgery.  Three or four small incisions will be made in your abdomen.  A small device with a light (laparoscope) will be inserted into one of your incisions. Surgical instruments will be inserted through the other incisions in order to perform the procedure.  Your uterus and cervix may be removed through your vagina or cut into small pieces and removed through the small incisions. Any other organs that need to be removed will also be removed this way.  The gas will be released from inside your abdomen.  Your incisions will be closed with stitches (sutures), skin glue, or adhesive strips.  A bandage (dressing) may be placed over your incisions. The procedure may vary among health care providers and hospitals. What happens after the procedure?  Your blood pressure, heart rate, breathing rate, and blood oxygen level will be monitored until you leave the hospital or clinic.  You will be given medicine for pain as needed.  You will be encouraged to walk as soon as possible. You will also use a device to help  you breathe or do breathing exercises to keep your lungs clear.  You may have to wear compression stockings. These stockings help to prevent blood clots and reduce swelling in your legs.  You will need to wear a sanitary pad for vaginal discharge or bleeding. Summary  Total laparoscopic hysterectomy is a procedure to remove your uterus, cervix, and sometimes the fallopian tubes and ovaries.  This procedure can affect the way you feel about yourself. Talk with your health care provider about the physical and emotional changes hysterectomy may cause.  After this procedure, you will no longer be able to have a baby, and you will no longer have a menstrual period.  You will be given pain medicine to control discomfort after this procedure.  Plan to have a responsible adult take you home from the hospital or clinic. This information is not intended to replace advice given to you by your health care provider. Make sure you discuss any questions you have with your health care provider. Document Revised: 06/04/2020 Document Reviewed: 06/04/2020 Elsevier Patient Education  2021 [employer].

## 2021-01-17 NOTE — Progress Notes (Signed)
HPI:      [name] is a 40 y.o. G3P3000 who LMP was Patient's last menstrual period was 01/09/2021., she presents today for her annual examination. The patient has no complaints today, except for ongoing concerns over her risk for ovarian cancer.  Her mother who has battled ovarian cancer for 6 years just passed away.  She desires no more child bearing (daughters are 11,17,and 85).  Denies pelvic pain, bloating, or weight changes.  Prior [facility] gene testing neg, as for mother as well.  Other family members have the pos gene for BRCA.  The patient is sexually active. Her last pap: approximate date 2019 and was normal and last mammogram: approximate date 2021 and was normal. The patient does perform self breast exams.  There is notable family history of breast or ovarian cancer in her family.  The patient has regular exercise: yes.  The patient denies current symptoms of depression.    GYN History: Contraception: vasectomy  PMHx: Past Medical History:  Diagnosis Date  . Cervicalgia   . Depression   . Family history of adverse reaction to anesthesia    MOM-N/V  . Family history of breast cancer    IBIS=20.4% 3/16  . Genetic testing 08/2014   RAD51C POSITIVE (increased ovar ca risk--6.7% to age 70)  . GERD (gastroesophageal reflux disease)    OCC- NO MEDS  . Headache    MIGRAINES  . Increased risk of breast cancer    IBIS=20.4%  . 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 Ovarian cancer genetic susceptibility    RAD51C pos; consider BSO age 46-50   Past Surgical History:  Procedure Laterality Date  . APPENDECTOMY    . CHOLECYSTECTOMY N/A 08/24/2017   Procedure: LAPAROSCOPIC CHOLECYSTECTOMY WITH INTRAOPERATIVE CHOLANGIOGRAM;  Surgeon: Jasier Calabretta Bellow, MD;  Location: [facility] ORS;  Service: General;  Laterality: N/A;  . LAPAROSCOPIC LYSIS OF ADHESIONS  08/24/2017   Procedure: LAPAROSCOPIC LYSIS OF ADHESIONS;  Surgeon: Erine Phenix Bellow, MD;   Location: [facility] ORS;  Service: General;;   Family History  Problem Relation Age of Onset  . Breast cancer Mother        65 and 20  . Ovarian cancer Mother 10  . Colon polyps Father   . Breast cancer Maternal Grandmother        31 and 62  . Breast cancer Cousin    Social History   Tobacco Use  . Smoking status: Never Smoker  . Smokeless tobacco: Never Used  Vaping Use  . Vaping Use: Never used  Substance Use Topics  . Alcohol use: No  . Drug use: No    Current Outpatient Medications:  .  acetaminophen (TYLENOL) 325 MG tablet, Take 650 mg by mouth every 6 (six) hours as needed for moderate pain or headache. (Patient not taking: Reported on 01/17/2021), Disp: , Rfl:  .  HYDROcodone-acetaminophen (NORCO) 5-325 MG tablet, Take 1-2 tablets every 4 (four) hours as needed by mouth. Take no more than 10 tablets daily. (Patient not taking: No sig reported), Disp: 15 tablet, Rfl: 0 .  ibuprofen (ADVIL,MOTRIN) 200 MG tablet, Take 200 mg by mouth every 6 (six) hours as needed. (Patient not taking: Reported on 01/17/2021), Disp: , Rfl:  .  naproxen sodium (ANAPROX) 220 MG tablet, Take 440 mg by mouth 2 (two) times daily as needed (for pain or headache). (Patient not taking: Reported on 01/17/2021), Disp: , Rfl:  Allergies: Patient has no known  allergies.  Review of Systems  Constitutional: Negative for chills, fever and malaise/fatigue.  HENT: Negative for congestion, sinus pain and sore throat.   Eyes: Negative for blurred vision and pain.  Respiratory: Negative for cough and wheezing.   Cardiovascular: Negative for chest pain and leg swelling.  Gastrointestinal: Negative for abdominal pain, constipation, diarrhea, heartburn, nausea and vomiting.  Genitourinary: Negative for dysuria, frequency, hematuria and urgency.  Musculoskeletal: Negative for back pain, joint pain, myalgias and neck pain.  Skin: Negative for itching and rash.  Neurological: Negative for dizziness, tremors and weakness.   Endo/Heme/Allergies: Does not bruise/bleed easily.  Psychiatric/Behavioral: Negative for depression. The patient is not nervous/anxious and does not have insomnia.     Objective: BP 120/82   Ht 5' 1"  (1.549 m)   Wt 196 lb (88.9 kg)   LMP 01/09/2021   BMI 37.03 kg/m   Filed Weights   01/17/21 1437  Weight: 196 lb (88.9 kg)   Body mass index is 37.03 kg/m. Physical Exam Constitutional:      General: She is not in acute distress.    Appearance: She is well-developed.  Genitourinary:     Rectum normal.     No lesions in the vagina.     No vaginal bleeding.      Right Adnexa: not tender and no mass present.    Left Adnexa: not tender and no mass present.    No cervical motion tenderness, friability, lesion or polyp.     Uterus is not enlarged.     No uterine mass detected.    Uterus is midaxial.     Pelvic exam was performed with patient in the lithotomy position.  Breasts:     Right: No mass, skin change or tenderness.     Left: No mass, skin change or tenderness.    HENT:     Head: Normocephalic and atraumatic. No laceration.     Right Ear: Hearing normal.     Left Ear: Hearing normal.     Mouth/Throat:     Pharynx: Uvula midline.  Eyes:     Pupils: Pupils are equal, round, and reactive to light.  Neck:     Thyroid: No thyromegaly.  Cardiovascular:     Rate and Rhythm: Normal rate and regular rhythm.     Heart sounds: No murmur heard. No friction rub. No gallop.   Pulmonary:     Effort: Pulmonary effort is normal. No respiratory distress.     Breath sounds: Normal breath sounds. No wheezing.  Abdominal:     General: Bowel sounds are normal. There is no distension.     Palpations: Abdomen is soft.     Tenderness: There is no abdominal tenderness. There is no rebound.  Musculoskeletal:        General: Normal range of motion.     Cervical back: Normal range of motion and neck supple.  Neurological:     Mental Status: She is alert and oriented to person,  place, and time.     Cranial Nerves: No cranial nerve deficit.  Skin:    General: Skin is warm and dry.  Psychiatric:        Judgment: Judgment normal.  Vitals reviewed.     Assessment:  ANNUAL EXAM 1. Women's annual routine gynecological examination   2. Family history of breast cancer   3. Family history of ovarian cancer   4. Encounter for screening mammogram for malignant neoplasm of breast      Screening Plan:  1.  Cervical Screening-  Pap smear done today  2. Breast screening- Exam annually and mammogram>40 planned   3. Colonoscopy every 10 years, Hemoccult testing - after age 65  4. Labs - to be done at a later time   5. Family history of breast cancer - MM 3D SCREEN BREAST BILATERAL; Future   6. Family history of ovarian cancer Pt desires removal of risk for ovarian cancer, by way of hysterectomy w oophorectomy.  Counseled as to the pros and cons of surgery, recovery, and menopause expected sx's and their treatments.  To consider, and info gv.       F/U  Return in about 1 year (around 01/17/2022) for Annual.  Barnett Applebaum, MD, [facility], [facility] 01/17/2021  3:17 PM

## 2021-01-19 LAB — CYTOLOGY - PAP: Diagnosis: NEGATIVE

## 2021-01-31 NOTE — Telephone Encounter (Signed)
Send this to [facility] first. He will need to send me the surgery request.

## 2021-05-11 ENCOUNTER — Other Ambulatory Visit: Payer: Self-pay

## 2021-05-11 ENCOUNTER — Ambulatory Visit
Admission: RE | Admit: 2021-05-11 | Discharge: 2021-05-11 | Disposition: A | Discharge status: Home / Self Care | Payer: Commercial Managed Care - PPO | Source: Ambulatory Visit | Attending: Obstetrics & Gynecology | Admitting: Obstetrics & Gynecology

## 2021-05-11 DIAGNOSIS — Z803 Family history of malignant neoplasm of breast: Secondary | ICD-10-CM | POA: Diagnosis present

## 2021-05-11 DIAGNOSIS — Z1231 Encounter for screening mammogram for malignant neoplasm of breast: Secondary | ICD-10-CM | POA: Insufficient documentation

## 2021-12-22 ENCOUNTER — Encounter: Payer: Self-pay | Admitting: Obstetrics & Gynecology

## 2022-03-30 ENCOUNTER — Other Ambulatory Visit: Payer: Self-pay | Admitting: Obstetrics & Gynecology

## 2022-03-30 DIAGNOSIS — Z1231 Encounter for screening mammogram for malignant neoplasm of breast: Secondary | ICD-10-CM

## 2022-06-02 ENCOUNTER — Ambulatory Visit
Admission: RE | Admit: 2022-06-02 | Discharge: 2022-06-02 | Disposition: A | Discharge status: Home / Self Care | Payer: Commercial Managed Care - PPO | Source: Ambulatory Visit | Attending: Obstetrics & Gynecology | Admitting: Obstetrics & Gynecology

## 2022-06-02 DIAGNOSIS — Z1231 Encounter for screening mammogram for malignant neoplasm of breast: Secondary | ICD-10-CM | POA: Insufficient documentation

## 2022-06-05 ENCOUNTER — Other Ambulatory Visit: Payer: Self-pay | Admitting: Obstetrics & Gynecology

## 2023-07-10 ENCOUNTER — Other Ambulatory Visit: Payer: Self-pay | Admitting: Obstetrics & Gynecology

## 2023-07-10 DIAGNOSIS — Z1231 Encounter for screening mammogram for malignant neoplasm of breast: Secondary | ICD-10-CM

## 2023-07-25 ENCOUNTER — Ambulatory Visit
Admission: RE | Admit: 2023-07-25 | Discharge: 2023-07-25 | Disposition: A | Discharge status: Home / Self Care | Payer: BC Managed Care – PPO | Source: Ambulatory Visit | Attending: Obstetrics & Gynecology | Admitting: Obstetrics & Gynecology

## 2023-07-25 ENCOUNTER — Encounter: Payer: Self-pay | Admitting: Radiology

## 2023-07-25 DIAGNOSIS — Z1231 Encounter for screening mammogram for malignant neoplasm of breast: Secondary | ICD-10-CM | POA: Insufficient documentation

## 2024-01-21 ENCOUNTER — Ambulatory Visit: Payer: Self-pay | Admitting: Internal Medicine

## 2024-02-07 ENCOUNTER — Other Ambulatory Visit: Payer: Self-pay | Admitting: Nurse Practitioner

## 2024-02-07 DIAGNOSIS — M546 Pain in thoracic spine: Secondary | ICD-10-CM

## 2024-02-07 DIAGNOSIS — D1730 Benign lipomatous neoplasm of skin and subcutaneous tissue of unspecified sites: Secondary | ICD-10-CM

## 2024-02-14 ENCOUNTER — Ambulatory Visit
Admission: RE | Admit: 2024-02-14 | Discharge: 2024-02-14 | Disposition: A | Discharge status: Home / Self Care | Source: Ambulatory Visit | Attending: Nurse Practitioner | Admitting: Nurse Practitioner

## 2024-02-14 DIAGNOSIS — M546 Pain in thoracic spine: Secondary | ICD-10-CM | POA: Diagnosis present

## 2024-02-14 DIAGNOSIS — D1730 Benign lipomatous neoplasm of skin and subcutaneous tissue of unspecified sites: Secondary | ICD-10-CM | POA: Insufficient documentation

## 2024-03-07 ENCOUNTER — Encounter: Payer: Self-pay | Admitting: Nurse Practitioner

## 2024-03-07 ENCOUNTER — Other Ambulatory Visit: Payer: Self-pay | Admitting: Nurse Practitioner

## 2024-03-07 DIAGNOSIS — M5134 Other intervertebral disc degeneration, thoracic region: Secondary | ICD-10-CM

## 2024-03-13 ENCOUNTER — Ambulatory Visit: Admission: RE | Admit: 2024-03-13 | Source: Ambulatory Visit
